# Patient Record
Sex: Female | Born: 1990 | Race: White | Hispanic: No | Marital: Married | Smoking: Former smoker
Health system: Southern US, Community
[De-identification: ages and names within clinical notes are randomized; demographics above are authoritative.]

## PROBLEM LIST (undated history)

## (undated) DIAGNOSIS — E282 Polycystic ovarian syndrome: Secondary | ICD-10-CM

## (undated) DIAGNOSIS — R112 Nausea with vomiting, unspecified: Secondary | ICD-10-CM

## (undated) DIAGNOSIS — N939 Abnormal uterine and vaginal bleeding, unspecified: Secondary | ICD-10-CM

## (undated) DIAGNOSIS — A749 Chlamydial infection, unspecified: Secondary | ICD-10-CM

## (undated) DIAGNOSIS — E669 Obesity, unspecified: Secondary | ICD-10-CM

## (undated) DIAGNOSIS — R791 Abnormal coagulation profile: Secondary | ICD-10-CM

## (undated) DIAGNOSIS — K439 Ventral hernia without obstruction or gangrene: Secondary | ICD-10-CM

## (undated) DIAGNOSIS — Z8041 Family history of malignant neoplasm of ovary: Secondary | ICD-10-CM

## (undated) HISTORY — DX: Obesity, unspecified: E66.9

## (undated) HISTORY — PX: TUBAL LIGATION: SHX77

## (undated) HISTORY — DX: Family history of malignant neoplasm of ovary: Z80.41

## (undated) HISTORY — DX: Chlamydial infection, unspecified: A74.9

---
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
## 2010-08-23 ENCOUNTER — Emergency Department: Payer: Self-pay | Admitting: Emergency Medicine

## 2011-02-13 ENCOUNTER — Observation Stay: Payer: Self-pay

## 2011-03-08 ENCOUNTER — Inpatient Hospital Stay: Payer: Self-pay | Admitting: Obstetrics and Gynecology

## 2012-09-17 ENCOUNTER — Emergency Department: Payer: Self-pay | Admitting: Emergency Medicine

## 2012-09-17 LAB — URINALYSIS, COMPLETE
Leukocyte Esterase: NEGATIVE
Nitrite: NEGATIVE
Ph: 5 (ref 4.5–8.0)
Protein: 100
RBC,UR: 2 /HPF (ref 0–5)
Specific Gravity: 1.034 (ref 1.003–1.030)
WBC UR: 3 /HPF (ref 0–5)

## 2012-09-17 LAB — WET PREP, GENITAL

## 2012-09-17 LAB — CBC
HGB: 13.9 g/dL (ref 12.0–16.0)
MCH: 30.5 pg (ref 26.0–34.0)
MCHC: 35.3 g/dL (ref 32.0–36.0)
Platelet: 192 10*3/uL (ref 150–440)
RBC: 4.57 10*6/uL (ref 3.80–5.20)

## 2012-09-17 LAB — GC/CHLAMYDIA PROBE AMP

## 2013-04-04 ENCOUNTER — Ambulatory Visit: Payer: Self-pay | Admitting: Obstetrics & Gynecology

## 2013-04-04 LAB — CBC WITH DIFFERENTIAL/PLATELET
Basophil #: 0 10*3/uL (ref 0.0–0.1)
Basophil %: 0.2 %
Eosinophil %: 1.5 %
HCT: 35.4 % (ref 35.0–47.0)
HGB: 12.2 g/dL (ref 12.0–16.0)
Lymphocyte #: 1.6 10*3/uL (ref 1.0–3.6)
Lymphocyte %: 15.4 %
MCH: 29.5 pg (ref 26.0–34.0)
MCHC: 34.3 g/dL (ref 32.0–36.0)
MCV: 86 fL (ref 80–100)
Neutrophil #: 8.2 10*3/uL — ABNORMAL HIGH (ref 1.4–6.5)
RBC: 4.13 10*6/uL (ref 3.80–5.20)
WBC: 10.6 10*3/uL (ref 3.6–11.0)

## 2013-04-06 ENCOUNTER — Inpatient Hospital Stay: Payer: Self-pay | Admitting: Obstetrics and Gynecology

## 2013-04-06 HISTORY — PX: TUBAL LIGATION: SHX77

## 2013-04-07 LAB — HEMATOCRIT: HCT: 29.1 % — ABNORMAL LOW (ref 35.0–47.0)

## 2013-04-09 LAB — PATHOLOGY REPORT

## 2013-09-14 IMAGING — US US OB < 14 WEEKS
1 series · 14 of 28 positions shown · non-contrast
Comparison: none

REASON FOR EXAM: pregnant, vaginal bleeding
COMMENTS:

PROCEDURE:     US  - US OB LESS THAN 14 WEEKS  - September 17, 2012  [DATE]
RESULT:     Limited OB ultrasound dated 09/17/2012
TECHNIQUE: Transabdominal imaging of the pelvis was obtained. Color flow
Doppler evaluation was also performed.

[Series 1: us ob < 14 weeks · 0.28mm/px · 14 of 64 slices shown]
[im 3/64]
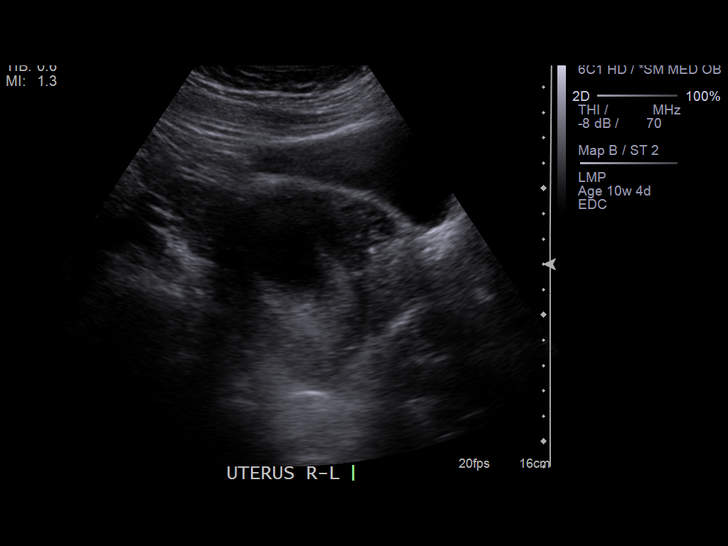
[im 8/64]
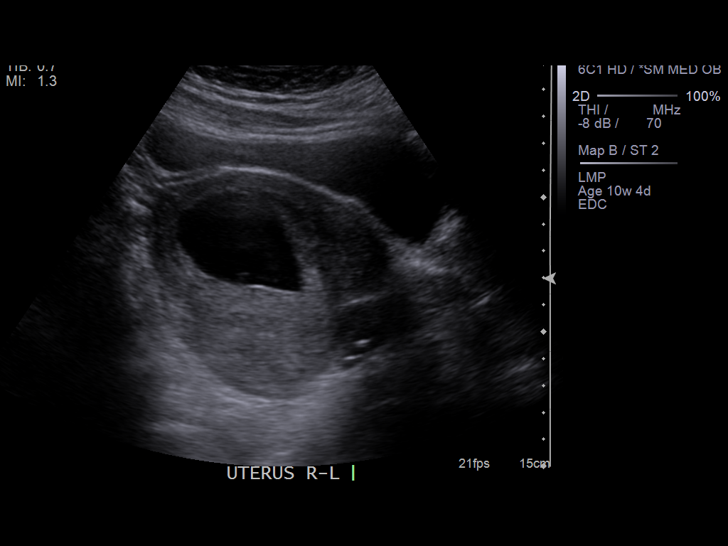
[im 12/64]
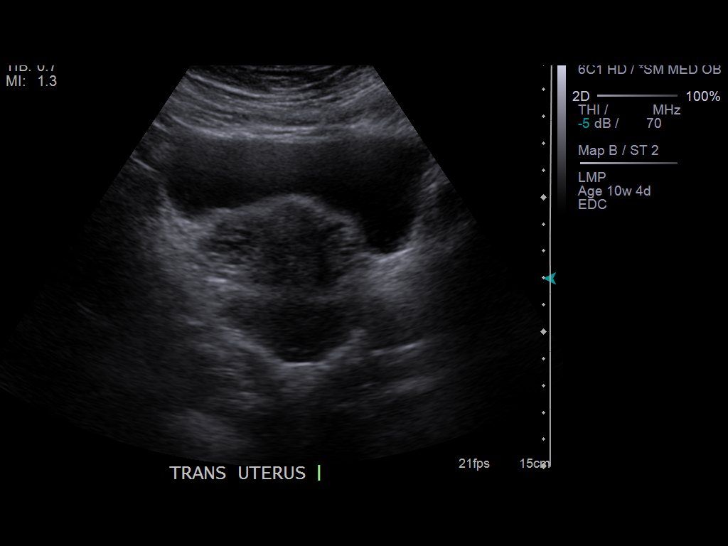
[im 17/64]
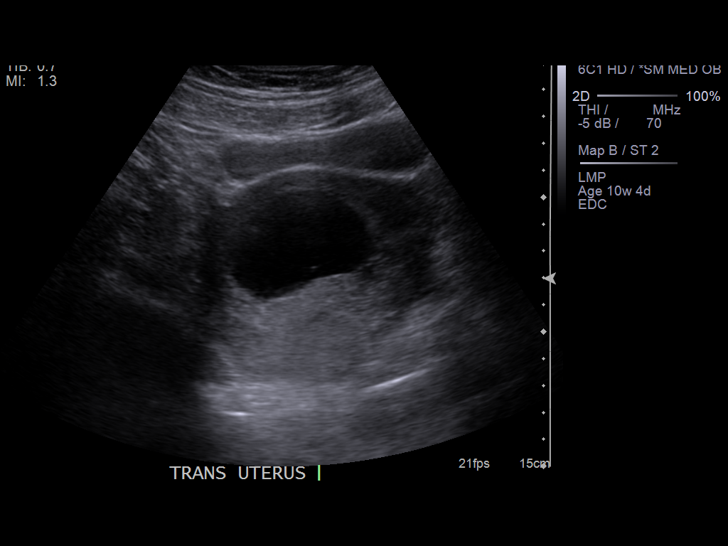
[im 22/64]
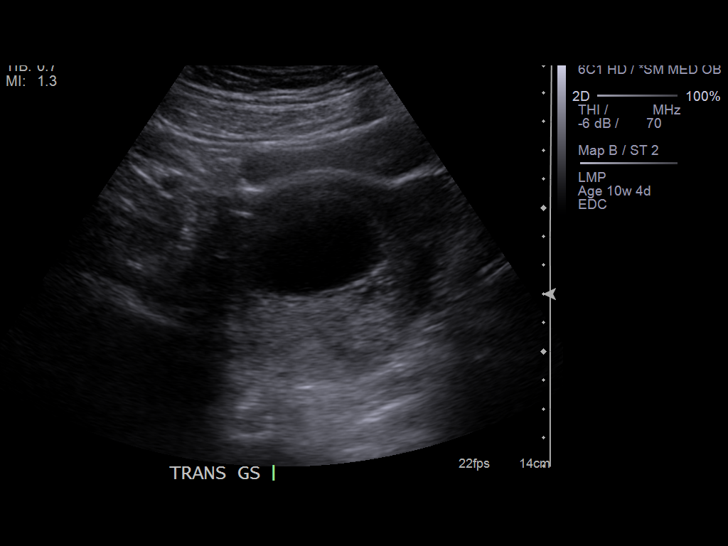
[im 26/64]
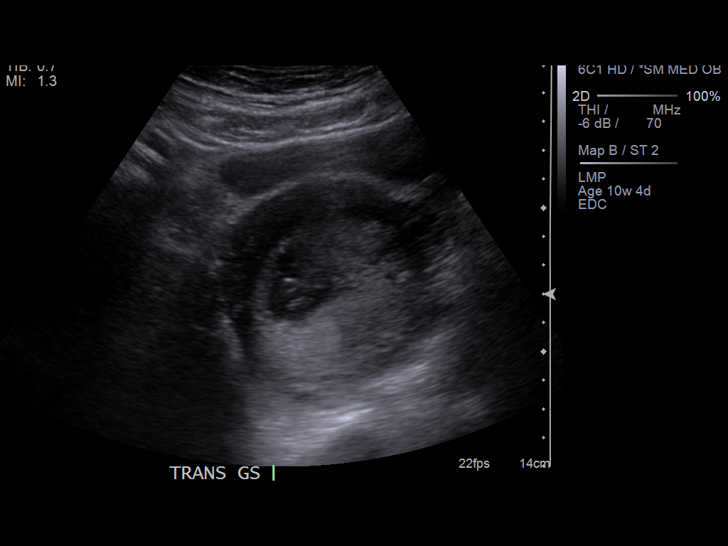
[im 31/64]
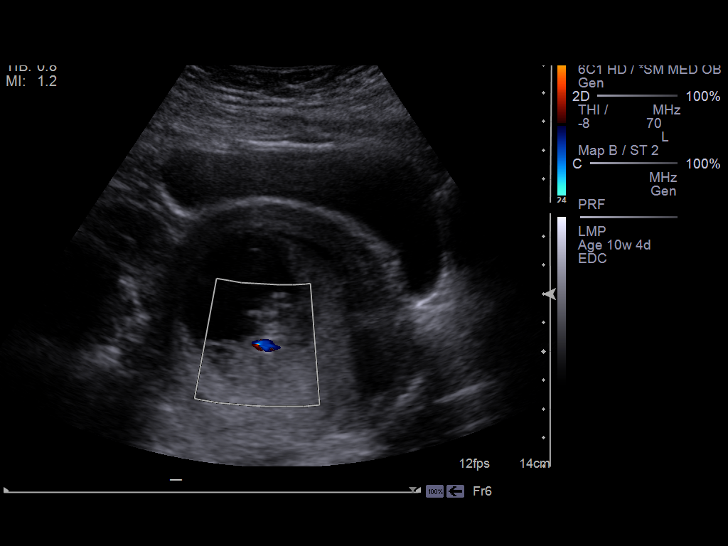
[im 36/64]
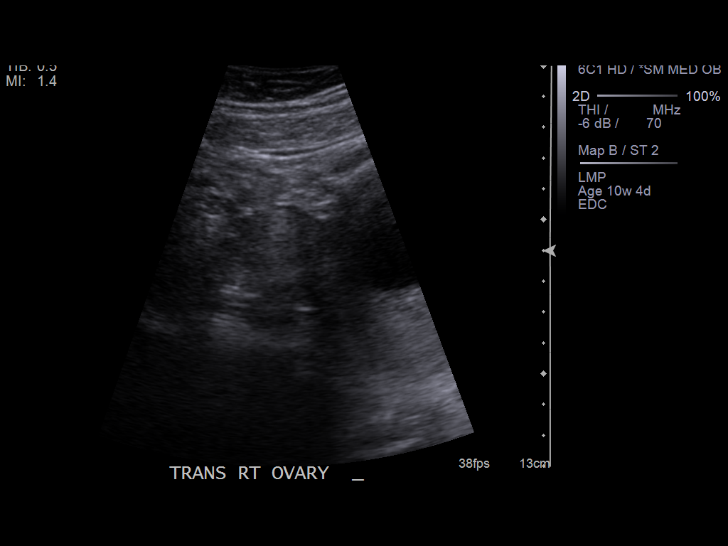
[im 40/64]
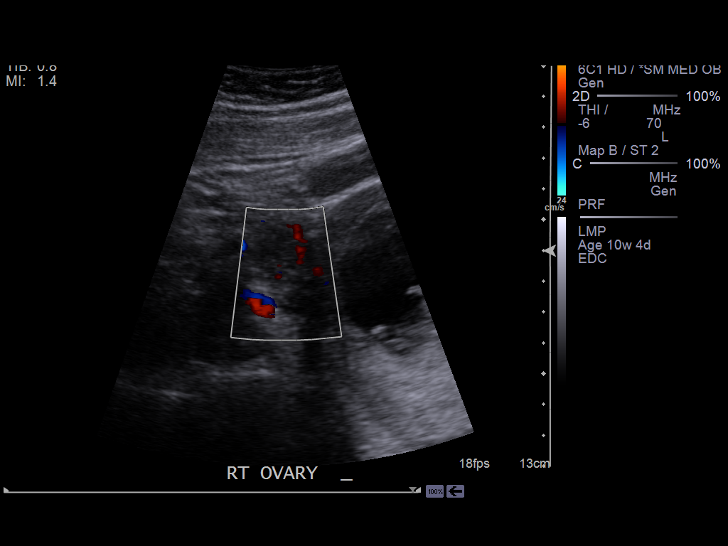
[im 45/64]
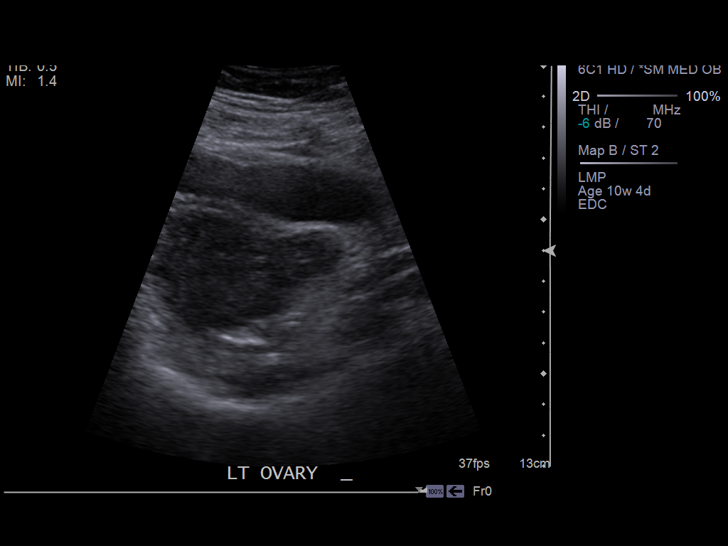
[im 50/64]
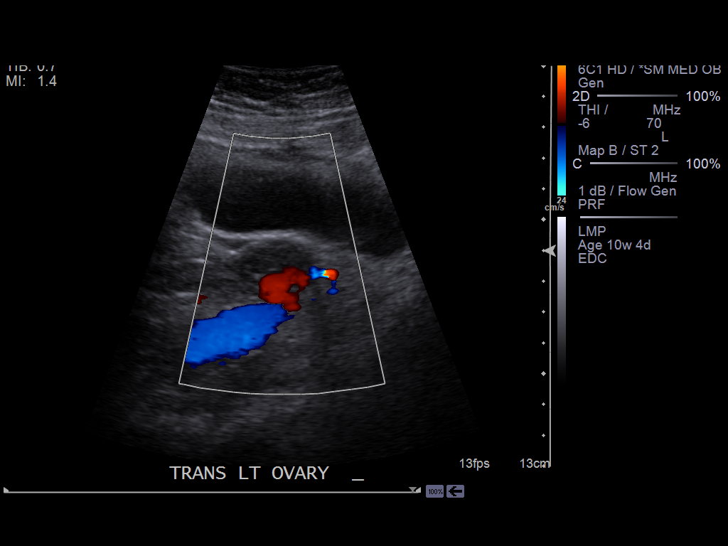
[im 54/64]
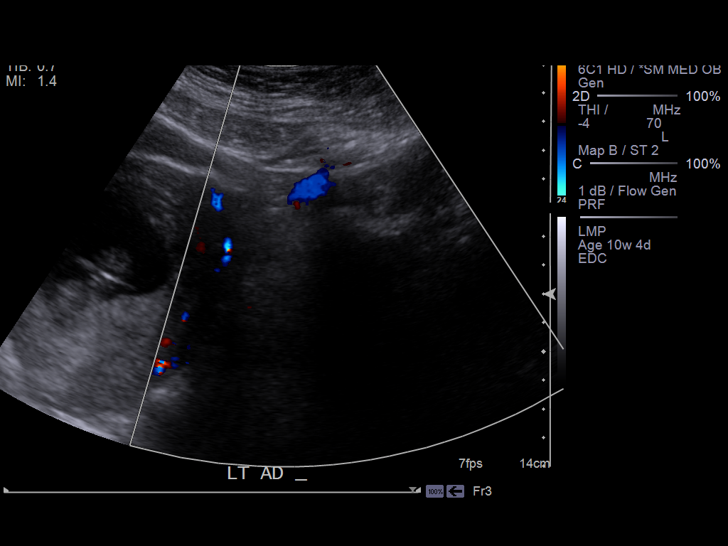
[im 59/64]
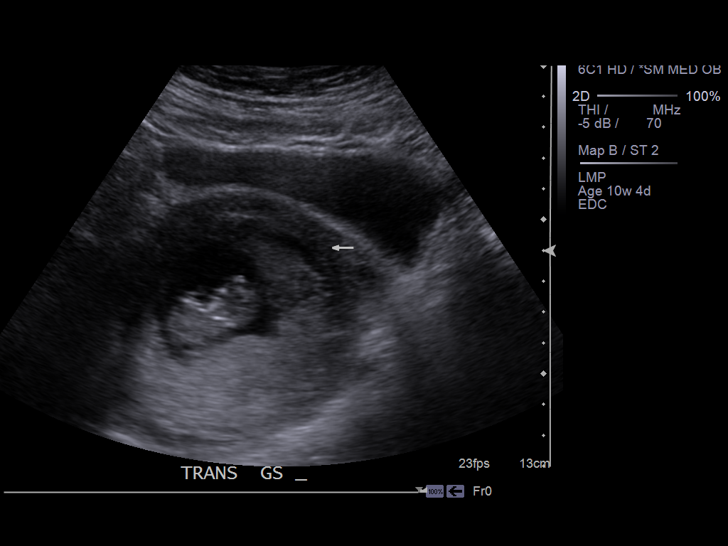
[im 64/64]
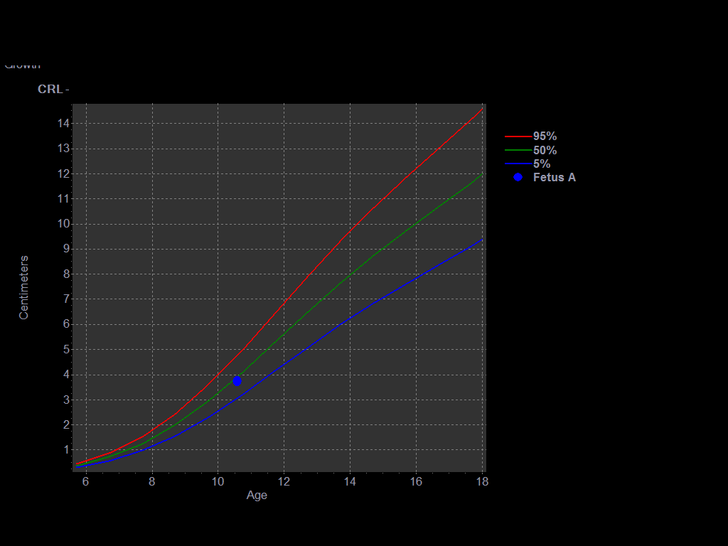

[14 of 28 positions shown; findings below may reference images not displayed]

FINDINGS: A single viable intrauterine pregnancy is appreciated within
estimated gestational age of 10 weeks 4 days based on a crown-rump length of
3.70 centimeters. Fetal heart tones identified with an estimated rate of 173
beats per minute. A crescent shaped hypoechoic area projects adjacent to the
gestational sac measuring approximately 2.5 to 3 cm in length. This finding
is appreciated on images 59 through 65. This finding is suspicious for a
subchorionic hemorrhage.

Adnexal regions are unremarkable. The right ovary measures 2.02 x 2.63 x
1.92 cm and the left 2.45 x 1.49 x 2.54 cm.
IMPRESSION: Single viable intrauterine pregnancy as described above.
There are findings may represent a small subchorionic hemorrhage
surveillance evaluation recommended.

## 2014-08-30 NOTE — Op Note (Signed)
PATIENT NAME:  Donna Fletcher, Donna Fletcher MR#:  [id number] DATE OF BIRTH:  1991-05-07  DATE OF PROCEDURE:  04/06/2013  PREOPERATIVE DIAGNOSES:  1.  Term pregnancy.  2.  History of prior birth trauma. 3.  Macrosomia. 4.  Desire for permanent sterility.   POSTOPERATIVE DIAGNOSES: 1.  Term pregnancy.  2.  History of prior birth trauma. 3.  Macrosomia. 4.  Desire for permanent sterility.   PROCEDURE PERFORMED:  1,  Low transverse cesarean section.  2.  Bilateral tubal ligation.  3.  Placement of On-Q pain pump.   SURGEON: Annamarie MajorPaul Carnisha Feltz, M.D.   ASSISTANT: Midwife, Sharen HonesGutierrez.   ANESTHESIA: Spinal.   ESTIMATED BLOOD LOSS: 500 mL.   COMPLICATIONS: None.   FINDINGS: Normal tubes, ovaries, and uterus. Viable female infant weighing 912 with Apgars of 9 and 9.   DISPOSITION: To recovery room stable.   TECHNIQUE: The patient is prepped and draped in the usual sterile fashion after adequate anesthesia is obtained in the supine position on the operating room table. A scalpel was used to create a low transverse skin incision down to the level of the rectus fascia, which is then dissected bilaterally using Mayo scissors. The rectus muscles are dissected away from the rectus fascia and separated in the midline. The peritoneum is penetrated and the bladder is inferiorly dissected and retracted. A scalpel was used to create a low transverse hysterotomy incision that is extended by blunt dissection. Amniotomy reveals clear fluid. The infant'Fletcher head is grasped and delivered without the use of a suction device and the oropharynx is suctioned. The infant is then completely delivered and handed to the pediatric team.   Cord blood is obtained and the placenta is manually extracted. The uterus is externalized and cleansed of all membranes and debris using a moist sponge. The hysterotomy incision is closed with running 1 Vicryl suture in a locking fashion with excellent hemostasis noted.   The right and left fallopian  tubes are grasped in the midportion with Babcock clamp and a loop is tied with 2 Vicryl sutures, excised, and cauterized. Attention is made to check for tube and fimbria and there is no bleeding at the conclusion of this part of the procedure.   The uterus is placed back in the intra-abdominal cavity and the paracolic gutters are irrigated with warm saline. Re-examination of all incisions reveals excellent hemostasis. The peritoneum is closed. Trocars are placed through the abdomen into the subfascial space for placement of the Silver soaker catheters related to the On-Q pain pump system. The fascia is then closed using a Maxon suture to overlie the catheters with careful placement not to incorporate the catheters in the suture. Subcutaneous tissues are irrigated and hemostasis is assured using electrocautery. Skin is closed with 4-0 Vicryl suture in a subcuticular fashion followed by placement of Dermabond. The catheters are stabilized in place using Dermabond followed by Steri-Strips and a sterile bandage. The patient goes to the recovery room in stable condition. All sponge, instrument, and needle counts are correct at the conclusion of the case.   ____________________________ R. Annamarie MajorPaul Solstice Lastinger, MD rph:aw D: 04/06/2013 08:45:36 ET T: 04/06/2013 09:35:49 ET JOB#: [id number]  cc: Dierdre Searles. Paul Jaslyn Bansal, MD, <Dictator> Nadara MustardOBERT P Tyeshia Cornforth MD ELECTRONICALLY SIGNED 04/09/2013 7:20

## 2015-08-13 ENCOUNTER — Ambulatory Visit: Payer: Self-pay | Admitting: Family Medicine

## 2015-08-15 ENCOUNTER — Ambulatory Visit: Payer: Self-pay | Admitting: Family Medicine

## 2015-08-27 ENCOUNTER — Ambulatory Visit: Payer: Self-pay | Admitting: Family Medicine

## 2015-08-27 ENCOUNTER — Other Ambulatory Visit: Payer: Self-pay

## 2015-09-01 ENCOUNTER — Ambulatory Visit: Payer: Self-pay | Admitting: Family Medicine

## 2015-09-09 ENCOUNTER — Encounter: Payer: Self-pay | Admitting: Emergency Medicine

## 2015-09-09 ENCOUNTER — Ambulatory Visit
Admission: EM | Admit: 2015-09-09 | Discharge: 2015-09-09 | Disposition: A | Discharge status: Home / Self Care | Payer: BLUE CROSS/BLUE SHIELD | Attending: Family Medicine | Admitting: Family Medicine

## 2015-09-09 DIAGNOSIS — J0100 Acute maxillary sinusitis, unspecified: Secondary | ICD-10-CM | POA: Diagnosis not present

## 2015-09-09 DIAGNOSIS — J0110 Acute frontal sinusitis, unspecified: Secondary | ICD-10-CM

## 2015-09-09 MED ORDER — FLUCONAZOLE 150 MG PO TABS: 150.0000 mg | ORAL_TABLET | Freq: Every day | ORAL | Status: DC

## 2015-09-09 MED ORDER — LORATADINE-PSEUDOEPHEDRINE ER 5-120 MG PO TB12
1.0000 | ORAL_TABLET | Freq: Two times a day (BID) | ORAL | Status: DC
Start: 2015-09-09 — End: 2015-10-19

## 2015-09-09 MED ORDER — AMOXICILLIN-POT CLAVULANATE 875-125 MG PO TABS: 1.0000 | ORAL_TABLET | Freq: Two times a day (BID) | ORAL | Status: DC

## 2015-09-09 NOTE — ED Provider Notes (Signed)
[facility]  ____________________________________________  Time seen: Approximately 6:02 PM  I have reviewed the triage vital signs and the nursing notes.   HISTORY  Chief Complaint Nasal Congestion and Facial Pain  HPI Donna Fletcher is a 25 y.o. female  presents for complaint of 2 weeks of runny nose, nasal congestion, nasal drainage and sinus pressure. Also reports sneezing and occasionally watery eyes. Patient reports she thought that she was having some seasonal allergies but states the symptoms progressed and have not resolved with over-the-counter antihistamines. Reports continues to eat and drink well. Reports felt she may have had a fever earlier on but denies recent fever. States recently blowing her nose and getting very thick greenish nasal drainage out. 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 Denies pain but states that her sinuses in the front of her face and forehead feel clogged. Denies home sick contacts.  Denies chest pain, shortness of breath, abdominal pain, wheezing, chest pain with deep breath, neck pain, back pain, dysuria, extremity pain or extremity swelling. Denies recent sickness. Denies recent antibiotic use. Reports symptoms have been unresolved with over-the-counter antihistamines, Mucinex, cough and congestion medications.   Patient's last menstrual period was 08/19/2015 (approximate). Denies chance of pregnancy.    History reviewed. No pertinent past medical history.  There are no active problems to display for this patient.   Past Surgical History  Procedure Laterality Date  . Cesarean section    . Tubal ligation      Current Outpatient Rx  Name  Route  Sig  Dispense  Refill  . loratadine (CLARITIN) 10 MG tablet   Oral   Take 10 mg by mouth daily.         . Norethin Ace-Eth Estrad-FE (TAYTULLA) 1-20 MG-MCG(24) CAPS   Oral   Take 1 capsule by mouth daily.         Marland Kitchen BELVIQ  10 MG TABS   Oral   Take 1 tablet by mouth 2 (two) times daily.      4     Dispense as written.   .           . phentermine (ADIPEX-P) 37.5 MG tablet      TAKE 1 TABLET BY MOUTH ONCE DAILY 30 MINUTES BEFORE BREAKFAST      0     Allergies Review of patient's allergies indicates no known allergies.  History reviewed. No pertinent family history.  Social History Social History  Substance Use Topics  . Smoking status: Former Games developer  . Smokeless tobacco: None  . Alcohol Use: No    Review of Systems Constitutional: As above.  Eyes: No visual changes. ENT: As above. Cardiovascular: Denies chest pain. Respiratory: Denies shortness of breath. Gastrointestinal: No abdominal pain.  No nausea, no vomiting.  No diarrhea.  No constipation. Genitourinary: Negative for dysuria. Musculoskeletal: Negative for back pain. Skin: Negative for rash. Neurological: Negative for headaches, focal weakness or numbness.  10-point ROS otherwise negative.  ____________________________________________   PHYSICAL EXAM:  VITAL SIGNS: ED Triage Vitals  Enc Vitals Group     BP 09/09/15 1734 109/73 mmHg     Pulse Rate 09/09/15 1734 85     Resp 09/09/15 1734 17     Temp 09/09/15 1734 98.1 F (36.7 C)     Temp Source 09/09/15 1734 Oral     SpO2 09/09/15 1734 100 %     Weight 09/09/15 1734 230 lb (104.327 kg)  Height 09/09/15 1734 5\' 4"  (1.626 m)     Head Cir --      Peak Flow --      Pain Score 09/09/15 1738 2     Pain Loc --      Pain Edu? --      Excl. in GC? --   Constitutional: Alert and oriented. Well appearing and in no acute distress. Eyes: Conjunctivae are normal. PERRL. EOMI. Head: Atraumatic.Mild to moderate tenderness to palpation bilateral frontal and maxillary sinuses. No swelling. No erythema.   Ears: no erythema, normal TMs bilaterally.   Nose: nasal congestion with bilateral nasal turbinate erythema and edema.   Mouth/Throat: Mucous membranes are moist.  Oropharynx  non-erythematous.No tonsillar swelling or exudate.  Neck: No stridor.  No cervical spine tenderness to palpation. Hematological/Lymphatic/Immunilogical: No cervical lymphadenopathy. Cardiovascular: Normal rate, regular rhythm. Grossly normal heart sounds.  Good peripheral circulation. Respiratory: Normal respiratory effort.  No retractions. Lungs CTAB. No wheezes, rales or rhonchi. Good air movement.  Gastrointestinal: Soft and nontender. No distention. Normal Bowel sounds. No CVA tenderness. Musculoskeletal: No lower or upper extremity tenderness nor edema.  Bilateral pedal pulses equal and easily palpated. No cervical, thoracic or lumbar tenderness to palpation.  Neurologic:  Normal speech and language. No gross focal neurologic deficits are appreciated. No gait instability. Skin:  Skin is warm, dry and intact. No rash noted. Psychiatric: Mood and affect are normal. Speech and behavior are normal.  ____________________________________________   LABS (all labs ordered are listed, but only abnormal results are displayed)  Labs Reviewed - No data to display   INITIAL IMPRESSION / ASSESSMENT AND PLAN / ED COURSE  Pertinent labs & imaging results that were available during my care of the patient were reviewed by me and considered in my medical decision making (see chart for details).  Very well-appearing patient. No acute distress. Presents for the complaints of 2 weeks of runny nose, nasal congestion, sinus drainage and sinus pressure. Lungs clear throughout. Abdomen soft and nontender. Moist membranes. Suspect sinusitis. Will treat with oral Augmentin and Claritin-D. Encouraged rest, fluids, saline nasal spray or use of Nettie pot. Encourage PCP follow-up. Patient requests prescription for Diflucan tablet in case she needs for a yeast infection second to antibiotic as she has a history of vaginal yeast infections from antibiotics, prescription given. Discussed indication, risks and benefits of  medications with patient.  Discussed follow up with Primary care physician this week. Discussed follow up and return parameters including no resolution or any worsening concerns. Patient verbalized understanding and agreed to plan.   ____________________________________________   FINAL CLINICAL IMPRESSION(S) / ED DIAGNOSES  Final diagnoses:  Acute maxillary sinusitis, recurrence not specified  Acute frontal sinusitis, recurrence not specified      Note: This dictation was prepared with Dragon dictation along with smaller phrase technology. Any transcriptional errors that result from this process are unintentional.    Renford DillsLindsey Jaquel Coomer, NP 09/09/15 1928  Renford DillsLindsey Adeli Frost, NP 09/09/15 1929

## 2015-09-09 NOTE — ED Notes (Signed)
Patient c/o sinus congestion, runny nose, and slight cough for the past 2 weeks.  Patient denies fevers.

## 2015-09-09 NOTE — Discharge Instructions (Signed)
Take medication as prescribed. Rest. Drink plenty of fluids.  ° °Follow up with your primary care physician this week as needed. Return to Urgent care for new or worsening concerns.  ° ° °Sinusitis, Adult °Sinusitis is redness, soreness, and inflammation of the paranasal sinuses. Paranasal sinuses are air pockets within the bones of your face. They are located beneath your eyes, in the middle of your forehead, and above your eyes. In healthy paranasal sinuses, mucus is able to drain out, and air is able to circulate through them by way of your nose. However, when your paranasal sinuses are inflamed, mucus and air can become trapped. This can allow bacteria and other germs to grow and cause infection. °Sinusitis can develop quickly and last only a short time (acute) or continue over a long period (chronic). Sinusitis that lasts for more than 12 weeks is considered chronic. °CAUSES °Causes of sinusitis include: °· Allergies. °· Structural abnormalities, such as displacement of the cartilage that separates your nostrils (deviated septum), which can decrease the air flow through your nose and sinuses and affect sinus drainage. °· Functional abnormalities, such as when the small hairs (cilia) that line your sinuses and help remove mucus do not work properly or are not present. °SIGNS AND SYMPTOMS °Symptoms of acute and chronic sinusitis are the same. The primary symptoms are pain and pressure around the affected sinuses. Other symptoms include: °· Upper toothache. °· Earache. °· Headache. °· Bad breath. °· Decreased sense of smell and taste. °· A cough, which worsens when you are lying flat. °· Fatigue. °· Fever. °· Thick drainage from your nose, which often is green and may contain pus (purulent). °· Swelling and warmth over the affected sinuses. °DIAGNOSIS °Your health care provider will perform a physical exam. During your exam, your health care provider may perform any of the following to help determine if you have  acute sinusitis or chronic sinusitis: °· Look in your nose for signs of abnormal growths in your nostrils (nasal polyps). °· Tap over the affected sinus to check for signs of infection. °· View the inside of your sinuses using an imaging device that has a light attached (endoscope). °If your health care provider suspects that you have chronic sinusitis, one or more of the following tests may be recommended: °· Allergy tests. °· Nasal culture. A sample of mucus is taken from your nose, sent to a lab, and screened for bacteria. °· Nasal cytology. A sample of mucus is taken from your nose and examined by your health care provider to determine if your sinusitis is related to an allergy. °TREATMENT °Most cases of acute sinusitis are related to a viral infection and will resolve on their own within 10 days. Sometimes, medicines are prescribed to help relieve symptoms of both acute and chronic sinusitis. These may include pain medicines, decongestants, nasal steroid sprays, or saline sprays. °However, for sinusitis related to a bacterial infection, your health care provider will prescribe antibiotic medicines. These are medicines that will help kill the bacteria causing the infection. °Rarely, sinusitis is caused by a fungal infection. In these cases, your health care provider will prescribe antifungal medicine. °For some cases of chronic sinusitis, surgery is needed. Generally, these are cases in which sinusitis recurs more than 3 times per year, despite other treatments. °HOME CARE INSTRUCTIONS °· Drink plenty of water. Water helps thin the mucus so your sinuses can drain more easily. °· Use a humidifier. °· Inhale steam 3-4 times a day (for example, sit in   the bathroom with the shower running). °· Apply a warm, moist washcloth to your face 3-4 times a day, or as directed by your health care provider. °· Use saline nasal sprays to help moisten and clean your sinuses. °· Take medicines only as directed by your health care  provider. °· If you were prescribed either an antibiotic or antifungal medicine, finish it all even if you start to feel better. °SEEK IMMEDIATE MEDICAL CARE IF: °· You have increasing pain or severe headaches. °· You have nausea, vomiting, or drowsiness. °· You have swelling around your face. °· You have vision problems. °· You have a stiff neck. °· You have difficulty breathing. °  °This information is not intended to replace advice given to you by your health care provider. Make sure you discuss any questions you have with your health care provider. °  °Document Released: 04/26/2005 Document Revised: 05/17/2014 Document Reviewed: 05/11/2011 °Elsevier Interactive Patient Education ©2016 [employer] ° °

## 2015-10-18 ENCOUNTER — Emergency Department
Admission: EM | Admit: 2015-10-18 | Discharge: 2015-10-19 | Disposition: A | Discharge status: Home / Self Care | Payer: BLUE CROSS/BLUE SHIELD | Attending: Emergency Medicine | Admitting: Emergency Medicine

## 2015-10-18 DIAGNOSIS — N390 Urinary tract infection, site not specified: Secondary | ICD-10-CM

## 2015-10-18 DIAGNOSIS — Z87891 Personal history of nicotine dependence: Secondary | ICD-10-CM | POA: Insufficient documentation

## 2015-10-18 DIAGNOSIS — R109 Unspecified abdominal pain: Secondary | ICD-10-CM | POA: Diagnosis present

## 2015-10-18 DIAGNOSIS — Z79899 Other long term (current) drug therapy: Secondary | ICD-10-CM | POA: Insufficient documentation

## 2015-10-18 NOTE — ED Notes (Signed)
Pt reports to ED w/ c/o R flank pain that incr w/ urination.  Pt also c/o vaginal pain/discharge. Ambulatory to triage, resp even and unlabored. NAD.

## 2015-10-19 LAB — WET PREP, GENITAL
CLUE CELLS WET PREP: NONE SEEN
SPERM: NONE SEEN
TRICH WET PREP: NONE SEEN
Yeast Wet Prep HPF POC: NONE SEEN

## 2015-10-19 LAB — CHLAMYDIA/NGC RT PCR (ARMC ONLY)
CHLAMYDIA TR: NOT DETECTED
N gonorrhoeae: NOT DETECTED

## 2015-10-19 LAB — URINALYSIS COMPLETE WITH MICROSCOPIC (ARMC ONLY)
Bilirubin Urine: NEGATIVE
Glucose, UA: NEGATIVE mg/dL
Hgb urine dipstick: NEGATIVE
Ketones, ur: NEGATIVE mg/dL
Nitrite: NEGATIVE
PH: 7 (ref 5.0–8.0)
PROTEIN: NEGATIVE mg/dL
Specific Gravity, Urine: 1.005 (ref 1.005–1.030)

## 2015-10-19 LAB — PREGNANCY, URINE: Preg Test, Ur: NEGATIVE

## 2015-10-19 MED ORDER — NITROFURANTOIN MONOHYD MACRO 100 MG PO CAPS: 100.0000 mg | ORAL_CAPSULE | Freq: Two times a day (BID) | ORAL | Status: AC

## 2015-10-19 NOTE — Discharge Instructions (Signed)
Please seek medical attention for any high fevers, chest pain, shortness of breath, change in behavior, persistent vomiting, bloody stool or any other new or concerning symptoms. ° ° °Urinary Tract Infection °A urinary tract infection (UTI) can occur any place along the urinary tract. The tract includes the kidneys, ureters, bladder, and urethra. A type of germ called bacteria often causes a UTI. UTIs are often helped with antibiotic medicine.  °HOME CARE  °· If given, take antibiotics as told by your doctor. Finish them even if you start to feel better. °· Drink enough fluids to keep your pee (urine) clear or pale yellow. °· Avoid tea, drinks with caffeine, and bubbly (carbonated) drinks. °· Pee often. Avoid holding your pee in for a long time. °· Pee before and after having sex (intercourse). °· Wipe from front to back after you poop (bowel movement) if you are a woman. Use each tissue only once. °GET HELP RIGHT AWAY IF:  °· You have back pain. °· You have lower belly (abdominal) pain. °· You have chills. °· You feel sick to your stomach (nauseous). °· You throw up (vomit). °· Your burning or discomfort with peeing does not go away. °· You have a fever. °· Your symptoms are not better in 3 days. °MAKE SURE YOU:  °· Understand these instructions. °· Will watch your condition. °· Will get help right away if you are not doing well or get worse. °  °This information is not intended to replace advice given to you by your health care provider. Make sure you discuss any questions you have with your health care provider. °  °Document Released: 10/13/2007 Document Revised: 05/17/2014 Document Reviewed: 11/25/2011 °Elsevier Interactive Patient Education ©2016 [employer] ° °

## 2015-10-19 NOTE — ED Provider Notes (Signed)
[facility] Emergency Department Provider Note   ____________________________________________  Time seen: ~0035  I have reviewed the triage vital signs and the nursing notes.   HISTORY  Chief Complaint Flank Pain   History limited by: Not Limited   HPI Donna Fletcher is a 25 y.o. female who presents to the emergency department today because of concerns for right flank pain and pain with urination. The patient states that she started having this is gout. It has been persistent since. She describes it as burning. It is worse with urination.She also feels like she has had increased frequency of urination. In addition the patient states that she has been having vaginal discharge. She states that it does smell like yeast. No fevers. Some nausea. No vomiting.    History reviewed. No pertinent past medical history.  There are no active problems to display for this patient.   Past Surgical History  Procedure Laterality Date  . Cesarean section    . Tubal ligation      Current Outpatient Rx  Name  Route  Sig  Dispense  Refill  . amoxicillin-clavulanate (AUGMENTIN) 875-125 MG tablet   Oral   Take 1 tablet by mouth every 12 (twelve) hours.   20 tablet   0   . BELVIQ 10 MG TABS   Oral   Take 1 tablet by mouth 2 (two) times daily.      4     Dispense as written.   . fluconazole (DIFLUCAN) 150 MG tablet   Oral   Take 1 tablet (150 mg total) by mouth daily.   1 tablet   0   . loratadine (CLARITIN) 10 MG tablet   Oral   Take 10 mg by mouth daily.         Marland Kitchen loratadine-pseudoephedrine (CLARITIN-D 12 HOUR) 5-120 MG tablet   Oral   Take 1 tablet by mouth 2 (two) times daily.   10 tablet   0   . Norethin Ace-Eth Estrad-FE (TAYTULLA) 1-20 MG-MCG(24) CAPS   Oral   Take 1 capsule by mouth daily.         . phentermine (ADIPEX-P) 37.5 MG tablet      TAKE 1 TABLET BY MOUTH ONCE DAILY 30 MINUTES BEFORE BREAKFAST      0      Allergies Review of patient's allergies indicates no known allergies.  No family history on file.  Social History Social History  Substance Use Topics  . Smoking status: Former Games developer  . Smokeless tobacco: None  . Alcohol Use: No    Review of Systems  Constitutional: Negative for fever. Cardiovascular: Negative for chest pain. Respiratory: Negative for shortness of breath. Gastrointestinal: Right sided pain. Neurological: Negative for headaches, focal weakness or numbness.  10-point ROS otherwise negative.  ____________________________________________   PHYSICAL EXAM:  VITAL SIGNS: ED Triage Vitals  Enc Vitals Group     BP 10/18/15 2234 133/95 mmHg     Pulse Rate 10/18/15 2234 88     Resp 10/18/15 2234 20     Temp 10/18/15 2234 98.1 F (36.7 C)     Temp Source 10/18/15 2234 Oral     SpO2 10/18/15 2234 99 %     Weight 10/18/15 2234 230 lb (104.327 kg)     Height 10/18/15 2234  (1.651 m)     Head Cir --      Peak Flow --      Pain Score 10/18/15 2235 6   Constitutional: Alert  and oriented. Well appearing and in no distress. Eyes: Conjunctivae are normal. PERRL. Normal extraocular movements. ENT   Head: Normocephalic and atraumatic.   Nose: No congestion/rhinnorhea.   Mouth/Throat: Mucous membranes are moist.   Neck: No stridor. Hematological/Lymphatic/Immunilogical: No cervical lymphadenopathy. Cardiovascular: Normal rate, regular rhythm.  No murmurs, rubs, or gallops. Respiratory: Normal respiratory effort without tachypnea nor retractions. Breath sounds are clear and equal bilaterally. No wheezes/rales/rhonchi. Gastrointestinal: Soft and nontender. No distention. There is no CVA tenderness. Genitourinary: No external lesions. Small amount of discharge in vaginal vault. No blood. No CMT. No adnexal fullness or tenderness.  Musculoskeletal: Normal range of motion in all extremities. No joint effusions.  No lower extremity tenderness nor  edema. Neurologic:  Normal speech and language. No gross focal neurologic deficits are appreciated.  Skin:  Skin is warm, dry and intact. No rash noted. Psychiatric: Mood and affect are normal. Speech and behavior are normal. Patient exhibits appropriate insight and judgment.  ____________________________________________    LABS (pertinent positives/negatives)  Labs Reviewed  URINALYSIS COMPLETEWITH MICROSCOPIC ([facility] ONLY) - Abnormal; Notable for the following:    Color, Urine STRAW (*)    APPearance CLEAR (*)    Leukocytes, UA TRACE (*)    Bacteria, UA RARE (*)    Squamous Epithelial / LPF 0-5 (*)    All other components within normal limits  PREGNANCY, URINE     ____________________________________________   EKG  None  ____________________________________________    RADIOLOGY  None  ____________________________________________   PROCEDURES  Procedure(s) performed: None  Critical Care performed: No  ____________________________________________   INITIAL IMPRESSION / ASSESSMENT AND PLAN / ED COURSE  Pertinent labs & imaging results that were available during my care of the patient were reviewed by me and considered in my medical decision making (see chart for details).  Patient presented to the emergency department today because of concerns for right sided flank pain and painful urination. Additionally patient has increased frequency and feeling of frequent need to urinate. Urine showed some leukocytes however no other concerning signs of infection. Pelvic exam was performed which did not have any adnexal tenderness or fullness. At this point I think patient might be suffering from urinary tract infection even with a fairly unremarkable UA. Will plan on treating with antibiotics. I doubt appendicitis at this point given lack of fever or significant tenderness. Additionally doubt tubo-ovarian abscess or other ovarian pathology given lack of adnexal tenderness.  Will plan on giving antibiotics for UTI and return precautions.  ____________________________________________   FINAL CLINICAL IMPRESSION(S) / ED DIAGNOSES  Final diagnoses:  Flank pain  UTI (lower urinary tract infection)     Note: This dictation was prepared with Dragon dictation. Any transcriptional errors that result from this process are unintentional    Phineas SemenGraydon Tisa Weisel, MD 10/19/15 [id number]

## 2016-03-31 ENCOUNTER — Ambulatory Visit
Admission: EM | Admit: 2016-03-31 | Discharge: 2016-03-31 | Disposition: A | Discharge status: Home / Self Care | Payer: BLUE CROSS/BLUE SHIELD | Attending: Emergency Medicine | Admitting: Emergency Medicine

## 2016-03-31 ENCOUNTER — Encounter: Payer: Self-pay | Admitting: *Deleted

## 2016-03-31 DIAGNOSIS — R350 Frequency of micturition: Secondary | ICD-10-CM

## 2016-03-31 DIAGNOSIS — N760 Acute vaginitis: Secondary | ICD-10-CM

## 2016-03-31 DIAGNOSIS — R3915 Urgency of urination: Secondary | ICD-10-CM

## 2016-03-31 DIAGNOSIS — B9689 Other specified bacterial agents as the cause of diseases classified elsewhere: Secondary | ICD-10-CM

## 2016-03-31 DIAGNOSIS — R1030 Lower abdominal pain, unspecified: Secondary | ICD-10-CM

## 2016-03-31 DIAGNOSIS — R109 Unspecified abdominal pain: Secondary | ICD-10-CM

## 2016-03-31 LAB — URINALYSIS COMPLETE WITH MICROSCOPIC (ARMC ONLY)
Bilirubin Urine: NEGATIVE
Glucose, UA: NEGATIVE mg/dL
HGB URINE DIPSTICK: NEGATIVE
Ketones, ur: NEGATIVE mg/dL
NITRITE: NEGATIVE
SPECIFIC GRAVITY, URINE: 1.02 (ref 1.005–1.030)
pH: 7 (ref 5.0–8.0)

## 2016-03-31 LAB — CHLAMYDIA/NGC RT PCR (ARMC ONLY)
Chlamydia Tr: NOT DETECTED
N gonorrhoeae: NOT DETECTED

## 2016-03-31 LAB — WET PREP, GENITAL
Clue Cells Wet Prep HPF POC: NONE SEEN
SPERM: NONE SEEN
Trich, Wet Prep: NONE SEEN
Yeast Wet Prep HPF POC: NONE SEEN

## 2016-03-31 MED ORDER — NITROFURANTOIN MONOHYD MACRO 100 MG PO CAPS: 100.0000 mg | ORAL_CAPSULE | Freq: Two times a day (BID) | ORAL | 0 refills | Status: AC

## 2016-03-31 MED ORDER — NITROFURANTOIN MONOHYD MACRO 100 MG PO CAPS: 100.0000 mg | ORAL_CAPSULE | Freq: Two times a day (BID) | ORAL | 0 refills | Status: DC

## 2016-03-31 MED ORDER — METRONIDAZOLE 500 MG PO TABS: 500.0000 mg | ORAL_TABLET | Freq: Two times a day (BID) | ORAL | 0 refills | Status: DC

## 2016-03-31 NOTE — ED Triage Notes (Signed)
Patient started having symptoms of lower back pain, urinary frequency and retention.

## 2016-03-31 NOTE — Discharge Instructions (Signed)
I believe you have an UTI.  I want you to go ahead and start taking macrobid for your UTI. Meanwhile, if you urine culture comes back negative, then you may stop it. Also I want you to f/u with a healthcare provider in 2 weeks to have your urine recheck to make sure the protein in your urine has resolved.

## 2016-03-31 NOTE — ED Provider Notes (Signed)
CSN: [id number]     Arrival date & time 03/31/16  1233 History   None    Chief Complaint  Patient presents with  . Urinary Tract Infection   (Consider location/radiation/quality/duration/timing/severity/associated sxs/prior Treatment) Donna Fletcher is a well-appearing 25 y.o female, presents today for possible UTI. Patient reports right flank pain onset 7 days ago. Flank pain is an intermittent dull ache associated with urinary urgency and frequency. She has no dysuria however reports flank pain when she urinates. She denies blood in urine. She also reports slight vaginal irritation and vaginal pain. She denies painful sex, denies vaginal discharge, denies vaginal itchiness or odor. She does endorses some lower abdominal discomfort. She called her OBGYN office last week and was recommended to try Monistat OTC, which she did and reports that the monistat helped with the irritation.      History reviewed. No pertinent past medical history. Past Surgical History:  Procedure Laterality Date  . CESAREAN SECTION    . TUBAL LIGATION     History reviewed. No pertinent family history. Social History  Substance Use Topics  . Smoking status: Former Games developermoker  . Smokeless tobacco: Never Used  . Alcohol use No   OB History    No data available     Review of Systems  Constitutional: Negative for chills, fatigue and fever.  Respiratory: Negative for cough and shortness of breath.   Cardiovascular: Negative for chest pain and palpitations.  Gastrointestinal: Positive for abdominal pain. Negative for diarrhea, nausea and vomiting.  Genitourinary: Positive for flank pain, frequency, urgency and vaginal pain. Negative for dyspareunia, dysuria, hematuria, vaginal bleeding and vaginal discharge.    Allergies  Patient has no known allergies.  Home Medications   Prior to Admission medications   Medication Sig Start Date End Date Taking? Authorizing Provider  BELVIQ 10 MG TABS Take 1 tablet by  mouth 2 (two) times daily. 07/02/15   Historical Provider, MD  loratadine (CLARITIN) 10 MG tablet Take 10 mg by mouth daily.    Historical Provider, MD  nitrofurantoin, macrocrystal-monohydrate, (MACROBID) 100 MG capsule Take 1 capsule (100 mg total) by mouth 2 (two) times daily. 03/31/16 04/05/16  Lucia EstelleFeng Tamerra Merkley, NP  Norethin Ace-Eth Estrad-FE (TAYTULLA) 1-20 MG-MCG(24) CAPS Take 1 capsule by mouth daily.    Historical Provider, MD  phentermine (ADIPEX-P) 37.5 MG tablet TAKE 1 TABLET BY MOUTH ONCE DAILY 30 MINUTES BEFORE BREAKFAST 05/29/15   Historical Provider, MD   Meds Ordered and Administered this Visit  Medications - No data to display  BP 119/85 (BP Location: Left Arm)   Pulse 93   Temp 98.1 F (36.7 C) (Oral)   Resp 16   Ht 5\' 4"  (1.626 m)   Wt 215 lb (97.5 kg)   LMP 02/25/2016   SpO2 100%   BMI 36.90 kg/m  No data found.   Physical Exam  Constitutional: She is oriented to person, place, and time. She appears well-developed and well-nourished.  HENT:  Head: Normocephalic and atraumatic.  Cardiovascular: Normal rate, regular rhythm and normal heart sounds.   Pulmonary/Chest: Effort normal and breath sounds normal. No respiratory distress. She has no wheezes.  Abdominal: Soft. Bowel sounds are normal. She exhibits no distension. There is no tenderness.  Reports pressure at the lower mid abdomen  Genitourinary:  Genitourinary Comments: Labia majora and minora unremarkable and no lesion. Vaginal canal pink and moist with small amt of white thin discharge without odor. Cervix low, firm, no lesion. Has stage 1-2 uterine prolapse, -CMT, -adenxal  tenderness, -uterine tenderness.   Neurological: She is alert and oriented to person, place, and time.  Skin: Skin is warm and dry.  Nursing note and vitals reviewed.   Urgent Care Course   Clinical Course     Procedures (including critical care time)  Labs Review Labs Reviewed  WET PREP, GENITAL - Abnormal; Notable for the  following:       Result Value   WBC, Wet Prep HPF POC FEW (*)    All other components within normal limits  URINALYSIS COMPLETEWITH MICROSCOPIC ([facility] ONLY) - Abnormal; Notable for the following:    APPearance HAZY (*)    Protein, ur TRACE (*)    Leukocytes, UA SMALL (*)    Bacteria, UA MANY (*)    Squamous Epithelial / LPF 6-30 (*)    All other components within normal limits  CHLAMYDIA/NGC RT PCR ([facility] ONLY)  URINE CULTURE    Imaging Review No results found.  MDM   1. Urinary frequency   2. Urinary urgency   3. Lower abdominal pain   4. Right flank pain    1) Wet prep shows few WBC. Negative clue cell, negative yeast and neg trich. GC/CT pending.  2) UA shows trace protein, small Leukocytes, many bacteria. UTI high in differential. Will tx for UTI today with Macrobid BID x 5 days. Informed to f/u with PCP in 2 weeks after her symptoms resolved to have her urine recheck due to presence of proteinuria today.    Lucia EstelleFeng Hiroyuki Ozanich, NP 03/31/16 1447

## 2016-04-03 ENCOUNTER — Telehealth: Payer: Self-pay | Admitting: *Deleted

## 2016-04-03 LAB — URINE CULTURE

## 2016-04-03 NOTE — Telephone Encounter (Signed)
Called patient, verified DOB, communicated inconclusive urine culture result. Patient reported taking prescribed antibiotic and feeling much better. Advised patient to follow up with PCP or [facility] if symptoms persist.

## 2016-07-08 ENCOUNTER — Ambulatory Visit: Payer: Self-pay | Admitting: Advanced Practice Midwife

## 2016-07-09 ENCOUNTER — Ambulatory Visit (INDEPENDENT_AMBULATORY_CARE_PROVIDER_SITE_OTHER): Payer: BLUE CROSS/BLUE SHIELD | Admitting: Obstetrics & Gynecology

## 2016-07-09 VITALS — BP 100/60 | Ht 64.0 in | Wt 268.0 lb

## 2016-07-09 DIAGNOSIS — E6609 Other obesity due to excess calories: Secondary | ICD-10-CM | POA: Diagnosis not present

## 2016-07-09 DIAGNOSIS — N3001 Acute cystitis with hematuria: Secondary | ICD-10-CM

## 2016-07-09 DIAGNOSIS — N914 Secondary oligomenorrhea: Secondary | ICD-10-CM | POA: Diagnosis not present

## 2016-07-09 DIAGNOSIS — R300 Dysuria: Secondary | ICD-10-CM

## 2016-07-09 DIAGNOSIS — Z6841 Body Mass Index (BMI) 40.0 and over, adult: Secondary | ICD-10-CM

## 2016-07-09 DIAGNOSIS — IMO0001 Reserved for inherently not codable concepts without codable children: Secondary | ICD-10-CM

## 2016-07-09 LAB — POCT URINALYSIS DIPSTICK
KETONES UA: NEGATIVE
NITRITE UA: NEGATIVE
PROTEIN UA: NEGATIVE
Spec Grav, UA: 1.01
Urobilinogen, UA: NEGATIVE
pH, UA: 6

## 2016-07-09 MED ORDER — PHENTERMINE HCL 37.5 MG PO TABS: ORAL_TABLET | ORAL | 0 refills | Status: DC

## 2016-07-09 MED ORDER — SULFAMETHOXAZOLE-TRIMETHOPRIM 800-160 MG PO TABS
1.0000 | ORAL_TABLET | Freq: Two times a day (BID) | ORAL | 1 refills | Status: DC
Start: 2016-07-09 — End: 2017-12-17

## 2016-07-09 MED ORDER — NORGESTIMATE-ETH ESTRADIOL 0.25-35 MG-MCG PO TABS: 1.0000 | ORAL_TABLET | Freq: Every day | ORAL | 11 refills | Status: DC

## 2016-07-09 NOTE — Progress Notes (Signed)
Is there a question?

## 2016-07-09 NOTE — Progress Notes (Signed)
HPI:      Donna Fletcher is a 26 y.o. G2P2 who LMP was 3 mos ago after Provera challenge, presents today for a problem visit.    Urinary Tract Infection: Patient complains of dysuria and urgency . She has had symptoms for 3 days. Patient also complains of back pain. Patient denies fever and vaginal discharge. Patient does have a history of recurrent UTI.  Patient does not have a history of pyelonephritis.   Also has no periods now, except w Provera.  Tried OCP (Taytulla) but too expensive. Also concerned over continued weight gain.  Has tried meds in past w some success.  PMHx: She  has no past medical history on file. Also,  has a past surgical history that includes Cesarean section and Tubal ligation., family history is not on file.,  reports that she has quit smoking. She has never used smokeless tobacco. She reports that she does not drink alcohol or use drugs.  She has a current medication list which includes the following prescription(s): loratadine, norgestimate-ethinyl estradiol, phentermine, and sulfamethoxazole-trimethoprim. Also, has No Known Allergies.  Review of Systems  Constitutional: Negative for chills, fever and malaise/fatigue.  HENT: Negative for congestion, sinus pain and sore throat.   Eyes: Negative for blurred vision and pain.  Respiratory: Negative for cough and wheezing.   Cardiovascular: Negative for chest pain and leg swelling.  Gastrointestinal: Negative for abdominal pain, constipation, diarrhea, heartburn, nausea and vomiting.  Genitourinary: Negative for dysuria, frequency, hematuria and urgency.  Musculoskeletal: Negative for back pain, joint pain, myalgias and neck pain.  Skin: Negative for itching and rash.  Neurological: Negative for dizziness, tremors and weakness.  Endo/Heme/Allergies: Does not bruise/bleed easily.  Psychiatric/Behavioral: Negative for depression. The patient is not nervous/anxious and does not have insomnia.      Objective: Vitals:   07/09/16 1512  BP: 100/60   Physical Exam  Constitutional: She is oriented to person, place, and time. She appears well-developed and well-nourished. No distress.  Genitourinary: Rectum normal and vagina normal. Pelvic exam was performed with patient supine. There is no rash or lesion on the right labia. There is no rash or lesion on the left labia. Vagina exhibits no lesion. No bleeding in the vagina.  Cardiovascular: Normal rate.   Pulmonary/Chest: Effort normal.  Abdominal: Soft. Bowel sounds are normal. She exhibits no distension. There is no tenderness. There is no rebound.  Musculoskeletal: Normal range of motion.  Neurological: She is alert and oriented to person, place, and time.  Skin: Skin is warm and dry.  Psychiatric: She has a normal mood and affect.  Vitals reviewed.   Results for orders placed or performed in visit on 07/09/16  POCT urinalysis dipstick  Result Value Ref Range   Color, UA yellow    Clarity, UA clear    Glucose, UA ned    Bilirubin, UA ned    Ketones, UA neg    Spec Grav, UA 1.010    Blood, UA moderate    pH, UA 6.0    Protein, UA neg    Urobilinogen, UA negative    Nitrite, UA neg    Leukocytes, UA 4+ (A) Negative    ASSESSMENT/PLAN:   Acute cystitis Dysuria - Plan: POCT urinalysis dipstick, Urine culture, sulfamethoxazole-trimethoprim (BACTRIM DS,SEPTRA DS) 800-160 MG tablet  Acute cystitis with hematuria - Plan: Urine culture, sulfamethoxazole-trimethoprim (BACTRIM DS,SEPTRA DS) 800-160 MG tablet  Class 3 obesity due to excess calories without serious comorbidity with body mass index (BMI) of  45.0 to 49.9 in adult [facility](HCC) - Plan: phentermine (ADIPEX-P) 37.5 MG tablet, norgestimate-ethinyl estradiol (ORTHO-CYCLEN,SPRINTEC,PREVIFEM) 0.25-35 MG-MCG tablet  Secondary oligomenorrhea - Plan: norgestimate-ethinyl estradiol (ORTHO-CYCLEN,SPRINTEC,PREVIFEM) 0.25-35 MG-MCG tablet  F/u 1 month  Annamarie MajorPaul Mory Herrman, MD,  Merlinda FrederickFACOG [facility], [facility] 07/09/2016  3:59 PM

## 2016-07-09 NOTE — Patient Instructions (Signed)

## 2016-07-11 LAB — URINE CULTURE: ORGANISM ID, BACTERIA: NO GROWTH

## 2016-07-27 ENCOUNTER — Ambulatory Visit: Payer: Self-pay | Admitting: Advanced Practice Midwife

## 2016-08-11 ENCOUNTER — Encounter: Payer: Self-pay | Admitting: Obstetrics & Gynecology

## 2016-08-16 ENCOUNTER — Ambulatory Visit: Payer: BLUE CROSS/BLUE SHIELD | Admitting: Obstetrics & Gynecology

## 2016-09-13 ENCOUNTER — Ambulatory Visit: Payer: BLUE CROSS/BLUE SHIELD | Admitting: Obstetrics & Gynecology

## 2016-10-25 ENCOUNTER — Ambulatory Visit
Admission: EM | Admit: 2016-10-25 | Discharge: 2016-10-25 | Disposition: A | Discharge status: Home / Self Care | Payer: Self-pay | Attending: Emergency Medicine | Admitting: Emergency Medicine

## 2016-10-25 ENCOUNTER — Encounter: Payer: Self-pay | Admitting: *Deleted

## 2016-10-25 DIAGNOSIS — R21 Rash and other nonspecific skin eruption: Secondary | ICD-10-CM

## 2016-10-25 DIAGNOSIS — B029 Zoster without complications: Secondary | ICD-10-CM

## 2016-10-25 MED ORDER — VALACYCLOVIR HCL 1 G PO TABS: 1000.0000 mg | ORAL_TABLET | Freq: Three times a day (TID) | ORAL | 0 refills | Status: AC

## 2016-10-25 MED ORDER — PREDNISONE 10 MG PO TABS: ORAL_TABLET | ORAL | 0 refills | Status: DC

## 2016-10-25 NOTE — ED Provider Notes (Signed)
[facility] ____________________________________________  Time seen: Approximately 0914 AM  I have reviewed the triage vital signs and the nursing notes.   HISTORY  Chief Complaint Rash  HPI Donna Fletcher is a 26 y.o. female presenting for evaluation of rash present to her chest that is in there for the last week. Patient reports having area to right and left medial chest 1 week, but reports she had 2 new areas this morning. States the rash is burning and itching. Denies any drainage. Reports other than new areas coming up today the rash has otherwise not changed over the last week. Denies any radiation of discomfort, paresthesias, injury or trauma. Denies insect bites, tick bite or tick attachment. Denies fevers. Denies any other rash or skin changes. Denies any changes in foods, medicines, lotions, detergents or contacts. Denies known trigger. Denies any aggravating or alleviating factors. States tried over-the-counter anti-itch cream, triamcinolone cream cream that she had at home and Benadryl without any change. Patient reports that she does sleep on her side which causes her breast to touch together the same area. Reports did have chickenpox as a child. Denies rash to breasts.   Denies chest pain, shortness of breath, abdominal pain, dysuria, extremity pain, extremity swelling or rash. Denies recent sickness. Denies recent antibiotic use.   Patient's last menstrual period was 10/23/2016. Denies pregnancy [place]: PCP   History reviewed. No pertinent past medical history. Denies There are no active problems to display for this patient.   Past Surgical History:  Procedure Laterality Date  . CESAREAN SECTION    . TUBAL LIGATION       No current facility-administered medications for this encounter.   Current Outpatient Prescriptions:  .  norgestimate-ethinyl estradiol (ORTHO-CYCLEN,SPRINTEC,PREVIFEM) 0.25-35 MG-MCG tablet, Take 1 tablet by  mouth daily., Disp: 1 Package, Rfl: 11 .  loratadine (CLARITIN) 10 MG tablet, Take 10 mg by mouth daily., Disp: , Rfl:  .  phentermine (ADIPEX-P) 37.5 MG tablet, TAKE 1 TABLET BY MOUTH ONCE DAILY 30 MINUTES BEFORE BREAKFAST, Disp: 30 tablet, Rfl: 0 .  predniSONE (DELTASONE) 10 MG tablet, Start 60 mg po day one, then 50 mg po day two, taper by 10 mg daily until complete., Disp: 21 tablet, Rfl: 0 .  sulfamethoxazole-trimethoprim (BACTRIM DS,SEPTRA DS) 800-160 MG tablet, Take 1 tablet by mouth 2 (two) times daily., Disp: 14 tablet, Rfl: 1 .  valACYclovir (VALTREX) 1000 MG tablet, Take 1 tablet (1,000 mg total) by mouth 3 (three) times daily., Disp: 21 tablet, Rfl: 0  Allergies Patient has no known allergies.  Family History  Problem Relation Age of Onset  . Hypertension Mother   . Ovarian cancer Mother [id number]  . Uterine cancer Mother   . Hypertension Father   . Diabetes Brother   . Hypertension Brother   . Diabetes Maternal Grandmother   . Hypertension Maternal Grandmother   . Hypertension Maternal Grandfather   . Hypertension Paternal Grandmother   . Hypertension Paternal Grandfather     Social History Social History  Substance Use Topics  . Smoking status: Former Games developermoker  . Smokeless tobacco: Never Used  . Alcohol use No    Review of Systems Constitutional: No fever/chills Eyes: No visual changes. ENT: No sore throat. Cardiovascular: Denies chest pain. Respiratory: Denies shortness of breath. Gastrointestinal: No abdominal pain.  No nausea, no vomiting.  No diarrhea.  No constipation. Genitourinary: Negative for dysuria. Musculoskeletal: Negative for back pain. Skin: Positive for rash. Neurological: Negative for headaches, focal weakness or  numbness.   ____________________________________________   PHYSICAL EXAM:  VITAL SIGNS: ED Triage Vitals  Enc Vitals Group     BP 10/25/16 0853 (!) 90/53     Pulse Rate 10/25/16 0853 88     Resp 10/25/16 0853 16     Temp 10/25/16  0853 98.6 F (37 C)     Temp Source 10/25/16 0853 Oral     SpO2 10/25/16 0853 99 %     Weight 10/25/16 0855 220 lb (99.8 kg)     Height 10/25/16 0855 5\' 5"  (1.651 m)     Head Circumference --      Peak Flow --      Pain Score 10/25/16 0856 4     Pain Loc --      Pain Edu? --      Excl. in GC? --     Constitutional: Alert and oriented. Well appearing and in no acute distress. ENT      Head: Normocephalic and atraumatic.      Mouth/Throat: Mucous membranes are moist.Oropharynx non-erythematous. Cardiovascular: Normal rate, regular rhythm. Grossly normal heart sounds.  Good peripheral circulation. Respiratory: Normal respiratory effort without tachypnea nor retractions. Breath sounds are clear and equal bilaterally. No wheezes, rales, rhonchi. Musculoskeletal: No midline cervical, thoracic or lumbar tenderness to palpation. Neurologic:  Normal speech and language.  Speech is normal. No gait instability.  Skin:  Skin is warm, dry. Except: Right and left medial chest times four erythematous base patches of vesicular lesions, no surrounding erythema, nontender, no drainage, no fluctuance, no punctum, non-excoriated, no other skin changes noted, 2 lesions to each side of midline, no honey crusted lesions or drainage. Psychiatric: Mood and affect are normal. Speech and behavior are normal. Patient exhibits appropriate insight and judgment   ___________________________________________   LABS (all labs ordered are listed, but only abnormal results are displayed)  Labs Reviewed - No data to display ____________________________________________  PROCEDURES Procedures     INITIAL IMPRESSION / ASSESSMENT AND PLAN / ED COURSE  Pertinent labs & imaging results that were available during my care of the patient were reviewed by me and considered in my medical decision making (see chart for details).  Very well-appearing patient. No acute distress. Patient with vesicular lesions present to  chest. Clinical appearance consistent with shingles. Discussed the patient rash does cross midline, but with strong appearance of shingles, suspect shingles rash. Discussed with patient to continue to monitor closely, avoid rubbing and touching. Keep clean. Will treat patient with prednisone taper and Valtrex 1 week. Discussed strict follow-up and return parameters.Discussed indication, risks and benefits of medications with patient.  Discussed follow up with Primary care physician this week. Discussed follow up and return parameters including no resolution or any worsening concerns. Patient verbalized understanding and agreed to plan.   ____________________________________________   FINAL CLINICAL IMPRESSION(S) / ED DIAGNOSES  Final diagnoses:  Herpes zoster without complication  Rash     Discharge Medication List as of 10/25/2016  9:33 AM    START taking these medications   Details  predniSONE (DELTASONE) 10 MG tablet Start 60 mg po day one, then 50 mg po day two, taper by 10 mg daily until complete., Normal    valACYclovir (VALTREX) 1000 MG tablet Take 1 tablet (1,000 mg total) by mouth 3 (three) times daily., Starting Mon 10/25/2016, Until Mon 11/01/2016, Normal        Note: This dictation was prepared with Dragon dictation along with smaller phrase technology. Any transcriptional errors that  result from this process are unintentional.         Renford Dills, NP 10/25/16 1105

## 2016-10-25 NOTE — ED Triage Notes (Signed)
Patient started having symptom of rash on her right upper chest 1 week ago and additional rash is now present on her left upper chest. No previous history of skin conditions.

## 2016-10-25 NOTE — Discharge Instructions (Signed)
Take medication as prescribed. Rest. Drink plenty of fluids. Keep clean. Avoid rubbing.   Follow up with your primary care physician this week as needed. Return to Urgent care for new or worsening concerns.

## 2016-11-16 ENCOUNTER — Ambulatory Visit: Payer: BLUE CROSS/BLUE SHIELD | Admitting: Obstetrics and Gynecology

## 2016-12-16 ENCOUNTER — Ambulatory Visit: Payer: BLUE CROSS/BLUE SHIELD | Admitting: Obstetrics & Gynecology

## 2016-12-27 ENCOUNTER — Ambulatory Visit (INDEPENDENT_AMBULATORY_CARE_PROVIDER_SITE_OTHER): Payer: Medicaid Other | Admitting: Obstetrics & Gynecology

## 2016-12-27 ENCOUNTER — Encounter: Payer: Self-pay | Admitting: Obstetrics & Gynecology

## 2016-12-27 VITALS — BP 120/80 | HR 69 | Wt 273.0 lb

## 2016-12-27 DIAGNOSIS — Z0000 Encounter for general adult medical examination without abnormal findings: Secondary | ICD-10-CM

## 2016-12-27 DIAGNOSIS — IMO0001 Reserved for inherently not codable concepts without codable children: Secondary | ICD-10-CM

## 2016-12-27 DIAGNOSIS — Z309 Encounter for contraceptive management, unspecified: Secondary | ICD-10-CM

## 2016-12-27 DIAGNOSIS — Z124 Encounter for screening for malignant neoplasm of cervix: Secondary | ICD-10-CM

## 2016-12-27 DIAGNOSIS — N914 Secondary oligomenorrhea: Secondary | ICD-10-CM

## 2016-12-27 MED ORDER — NORGESTIMATE-ETH ESTRADIOL 0.25-35 MG-MCG PO TABS: 1.0000 | ORAL_TABLET | Freq: Every day | ORAL | 3 refills | Status: DC

## 2016-12-27 MED ORDER — PHENTERMINE HCL 37.5 MG PO TABS: ORAL_TABLET | ORAL | 0 refills | Status: DC

## 2016-12-27 NOTE — Patient Instructions (Signed)

## 2016-12-27 NOTE — Progress Notes (Signed)
HPI:      Ms. Donna Fletcher is a 26 y.o. [id number] who LMP was Patient's last menstrual period was 11/29/2016., she presents today for her annual examination. The patient has no complaints today. The patient is sexually active. Her last pap: was normal. The patient does perform self breast exams.  There is no notable family history of breast or ovarian cancer in her family.  The patient has regular exercise: yes.  The patient denies current symptoms of depression.    GYN History: Contraception: tubal ligation  PMHx: No past medical history on file. Past Surgical History:  Procedure Laterality Date  . CESAREAN SECTION    . TUBAL LIGATION     Family History  Problem Relation Age of Onset  . Hypertension Mother   . Ovarian cancer Mother 31  . Uterine cancer Mother 91  . Hypertension Father   . Diabetes Brother   . Hypertension Brother   . Diabetes Maternal Grandmother   . Hypertension Maternal Grandmother   . Hypertension Maternal Grandfather   . Hypertension Paternal Grandmother   . Hypertension Paternal Grandfather    Social History  Substance Use Topics  . Smoking status: Former Games developer  . Smokeless tobacco: Never Used  . Alcohol use No    Current Outpatient Prescriptions:  .  norgestimate-ethinyl estradiol (ORTHO-CYCLEN,SPRINTEC,PREVIFEM) 0.25-35 MG-MCG tablet, Take 1 tablet by mouth daily., Disp: 3 Package, Rfl: 3 .  loratadine (CLARITIN) 10 MG tablet, Take 10 mg by mouth daily., Disp: , Rfl:  .  phentermine (ADIPEX-P) 37.5 MG tablet, TAKE 1 TABLET BY MOUTH ONCE DAILY 30 MINUTES BEFORE BREAKFAST, Disp: 30 tablet, Rfl: 0 .  predniSONE (DELTASONE) 10 MG tablet, Start 60 mg po day one, then 50 mg po day two, taper by 10 mg daily until complete. (Patient not taking: Reported on 12/27/2016), Disp: 21 tablet, Rfl: 0 .  sulfamethoxazole-trimethoprim (BACTRIM DS,SEPTRA DS) 800-160 MG tablet, Take 1 tablet by mouth 2 (two) times daily. (Patient not taking: Reported on 12/27/2016),  Disp: 14 tablet, Rfl: 1 Allergies: Patient has no known allergies.  Review of Systems  Constitutional: Negative for chills, fever and malaise/fatigue.  HENT: Negative for congestion, sinus pain and sore throat.   Eyes: Negative for blurred vision and pain.  Respiratory: Negative for cough and wheezing.   Cardiovascular: Negative for chest pain and leg swelling.  Gastrointestinal: Negative for abdominal pain, constipation, diarrhea, heartburn, nausea and vomiting.  Genitourinary: Negative for dysuria, frequency, hematuria and urgency.  Musculoskeletal: Negative for back pain, joint pain, myalgias and neck pain.  Skin: Negative for itching and rash.  Neurological: Negative for dizziness, tremors and weakness.  Endo/Heme/Allergies: Does not bruise/bleed easily.  Psychiatric/Behavioral: Negative for depression. The patient is not nervous/anxious and does not have insomnia.     Objective: BP 120/80   Pulse 69   Wt 273 lb (123.8 kg)   LMP 11/29/2016   BMI 45.43 kg/m   Filed Weights   12/27/16 1103  Weight: 273 lb (123.8 kg)   Body mass index is 45.43 kg/m. Physical Exam  Constitutional: She is oriented to person, place, and time. She appears well-developed and well-nourished. No distress.  Genitourinary: Rectum normal, vagina normal and uterus normal. Pelvic exam was performed with patient supine. There is no rash or lesion on the right labia. There is no rash or lesion on the left labia. Vagina exhibits no lesion. No bleeding in the vagina. Right adnexum does not display mass and does not display tenderness. Left adnexum does  not display mass and does not display tenderness. Cervix does not exhibit motion tenderness, lesion, friability or polyp.   Uterus is mobile and midaxial. Uterus is not enlarged or exhibiting a mass.  HENT:  Head: Normocephalic and atraumatic. Head is without laceration.  Right Ear: Hearing normal.  Left Ear: Hearing normal.  Nose: No epistaxis.  No foreign  bodies.  Mouth/Throat: Uvula is midline, oropharynx is clear and moist and mucous membranes are normal.  Eyes: Pupils are equal, round, and reactive to light.  Neck: Normal range of motion. Neck supple. No thyromegaly present.  Cardiovascular: Normal rate and regular rhythm.  Exam reveals no gallop and no friction rub.   No murmur heard. Pulmonary/Chest: Effort normal and breath sounds normal. No respiratory distress. She has no wheezes. Right breast exhibits no mass, no skin change and no tenderness. Left breast exhibits no mass, no skin change and no tenderness.  Abdominal: Soft. Bowel sounds are normal. She exhibits no distension. There is no tenderness. There is no rebound.  Musculoskeletal: Normal range of motion.  Neurological: She is alert and oriented to person, place, and time. No cranial nerve deficit.  Skin: Skin is warm and dry.  Psychiatric: She has a normal mood and affect. Judgment normal.  Vitals reviewed.   Assessment: 1. Annual physical exam   2. Morbid obesity (HCC)   3. Screening for cervical cancer   4. Class 3 obesity due to excess calories without serious comorbidity with body mass index (BMI) of 45.0 to 49.9 in adult (HCC)   5. Secondary oligomenorrhea    Plan:            1.  Cervical Screening-  Pap smear done today  2. Breast screening- Exam annually and mammogram>40 planned   3. Colonoscopy every 10 years, Hemoccult testing - after age 66  4. Labs managed by PCP  5. Counseling for contraception: bilateral tubal ligation, also on OCPs to reg cycles and it does help w dysmenorrhea   6. Morbid obesity (HCC) -Lifestyle measures discussed.  Meds also discussed.  Phentermine Rx and follow up    F/U  Return in about 1 week (around 01/03/2017) for Follow up.  Annamarie Major, MD, [facility], [facility] 12/27/2016  11:22 AM

## 2016-12-28 LAB — IGP, RFX APTIMA HPV ASCU: PAP Smear Comment: 0

## 2016-12-30 ENCOUNTER — Encounter: Payer: Self-pay | Admitting: Obstetrics and Gynecology

## 2017-01-31 ENCOUNTER — Ambulatory Visit (INDEPENDENT_AMBULATORY_CARE_PROVIDER_SITE_OTHER): Payer: Medicaid Other | Admitting: Obstetrics & Gynecology

## 2017-01-31 ENCOUNTER — Encounter: Payer: Self-pay | Admitting: Obstetrics & Gynecology

## 2017-01-31 DIAGNOSIS — E6609 Other obesity due to excess calories: Secondary | ICD-10-CM

## 2017-01-31 DIAGNOSIS — IMO0001 Reserved for inherently not codable concepts without codable children: Secondary | ICD-10-CM

## 2017-01-31 DIAGNOSIS — Z6841 Body Mass Index (BMI) 40.0 and over, adult: Secondary | ICD-10-CM | POA: Diagnosis not present

## 2017-01-31 MED ORDER — PHENTERMINE HCL 37.5 MG PO TABS: ORAL_TABLET | ORAL | 0 refills | Status: DC

## 2017-01-31 NOTE — Progress Notes (Signed)
  History of Present Illness:  Donna Fletcher is a 26 y.o. who was started on Phentermine approximately 4 weeks ago due to obesity/abnormal weight gain. The patient has lost 3 pounds over the past month due to meds and lifestyle changes (exercise daily am)..   She has these side effects: dry mouth and insomnia.  Lost a pant size and feels good overall  PMHx: She  has a past medical history of Chlamydia (2012, 2014); Family history of ovarian cancer; and Obesity. Also,  has a past surgical history that includes Cesarean section and Tubal ligation., family history includes Diabetes in her brother and maternal grandmother; Hypertension in her brother, father, maternal grandfather, maternal grandmother, mother, paternal grandfather, and paternal grandmother; Ovarian cancer (age of onset: 65) in her mother; Uterine cancer (age of onset: 51) in her mother.,  reports that she has quit smoking. She has never used smokeless tobacco. She reports that she does not drink alcohol or use drugs.  She has a current medication list which includes the following prescription(s): loratadine, norgestimate-ethinyl estradiol, phentermine, prednisone, and sulfamethoxazole-trimethoprim. Also, has No Known Allergies.  Review of Systems  All other systems reviewed and are negative.   Physical Exam:  BP 130/80   Pulse 94   Ht 5' 4.5" (1.638 m)   Wt 270 lb (122.5 kg)   LMP 12/29/2016   BMI 45.63 kg/m  Body mass index is 45.63 kg/m. Filed Weights   01/31/17 0826  Weight: 270 lb (122.5 kg)    Physical Exam  Constitutional: She is oriented to person, place, and time. She appears well-developed and well-nourished. No distress.  Musculoskeletal: Normal range of motion.  Neurological: She is alert and oriented to person, place, and time.  Skin: Skin is warm and dry.  Psychiatric: She has a normal mood and affect.  Vitals reviewed.   Assessment: morbid obesity Medication treatment is going well for  her.  Plan: Patient is continued/added to prescription appetite suppressants: PHENTERMINE.   Will continue to assist patient in incorporating positive experiences into her life to promote a positive mental attitude.  Education given regarding appropriate lifestyle changes for weight loss, including regular physical activity, healthy coping strategies, caloric restriction, and healthy eating patterns.  The risks and benefits as well as side effects of medication, such as Phenteramine or Tenuate, is discussed.  The pros and cons of suppressing appetite and boosting metabolism is counseled.  Risks of tolerance and addiction discussed.  Use of medicine will be short term.  Pt to call with any negative side effects and agrees to keep follow up appointments.  Monitor for continued insomnia, may need to adjust meds  A total of 15 minutes were spent face-to-face with the patient during this encounter and over half of that time dealt with counseling and coordination of care.  Annamarie Major, MD, [facility], [facility] 01/31/2017  8:34 AM

## 2017-03-02 ENCOUNTER — Ambulatory Visit: Payer: Medicaid Other | Admitting: Obstetrics & Gynecology

## 2017-03-28 ENCOUNTER — Ambulatory Visit: Payer: Medicaid Other | Admitting: Obstetrics & Gynecology

## 2017-12-17 ENCOUNTER — Ambulatory Visit (INDEPENDENT_AMBULATORY_CARE_PROVIDER_SITE_OTHER): Payer: BLUE CROSS/BLUE SHIELD

## 2017-12-17 ENCOUNTER — Encounter: Payer: Self-pay | Admitting: Gynecology

## 2017-12-17 ENCOUNTER — Ambulatory Visit
Admission: EM | Admit: 2017-12-17 | Discharge: 2017-12-17 | Disposition: A | Discharge status: Home / Self Care | Payer: BLUE CROSS/BLUE SHIELD | Attending: Family Medicine | Admitting: Family Medicine

## 2017-12-17 ENCOUNTER — Other Ambulatory Visit: Payer: Self-pay

## 2017-12-17 DIAGNOSIS — R109 Unspecified abdominal pain: Secondary | ICD-10-CM

## 2017-12-17 DIAGNOSIS — R300 Dysuria: Secondary | ICD-10-CM | POA: Diagnosis not present

## 2017-12-17 DIAGNOSIS — N201 Calculus of ureter: Secondary | ICD-10-CM | POA: Diagnosis not present

## 2017-12-17 LAB — URINALYSIS, COMPLETE (UACMP) WITH MICROSCOPIC
BACTERIA UA: NONE SEEN
Bilirubin Urine: NEGATIVE
Glucose, UA: NEGATIVE mg/dL
Ketones, ur: NEGATIVE mg/dL
Leukocytes, UA: NEGATIVE
Nitrite: NEGATIVE
PH: 7 (ref 5.0–8.0)
Protein, ur: NEGATIVE mg/dL
SPECIFIC GRAVITY, URINE: 1.01 (ref 1.005–1.030)

## 2017-12-17 MED ORDER — TAMSULOSIN HCL 0.4 MG PO CAPS: 0.4000 mg | ORAL_CAPSULE | Freq: Every day | ORAL | 0 refills | Status: DC

## 2017-12-17 MED ORDER — KETOROLAC TROMETHAMINE 60 MG/2ML IM SOLN
60.0000 mg | Freq: Once | INTRAMUSCULAR | Status: AC
  Administered 2017-12-17: 60 mg via INTRAMUSCULAR

## 2017-12-17 NOTE — ED Triage Notes (Signed)
Patient c/o frequent urination and back pain x 3 days.

## 2017-12-17 NOTE — ED Provider Notes (Signed)
[facility] ____________________________________________  Time seen: Approximately 1:10 PM  I have reviewed the triage vital signs and the nursing notes.   HISTORY  Chief Complaint Urinary Tract Infection   HPI Donna Fletcher is a 27 y.o. female presenting for evaluation of left flank pain present for the last 4 days, which she reports is worse with movements but present at all times.  States over-the-counter Tylenol has not relieved pain.  Also reports of the last 2 days due to some urinary frequency, but also further states she does drink a lot of water.  Denies burning with urination, vaginal discharge, rash or lesions.  Denies any vaginal complaints.  Denies abdominal pain.  States that she does occasionally have some vaginal spotting over the last week, and reports that last month she started a new oral contraceptive that was a 8430-month cycle pack.  Denies any concerns of pregnancy.  Reports previous tubal ligation and no recent sexual activity.  Denies other aggravating or alleviating factors.  Reports otherwise feels well. Denies recent sickness. Denies recent antibiotic use.  [place]: PCP   Past Medical History:  Diagnosis Date  . Chlamydia 2012, 2014  . Family history of ovarian cancer    genetic testing letter sent 8/18  . Obesity     Patient Active Problem List   Diagnosis Date Noted  . BMI 45.0-49.9, adult (HCC) 01/31/2017  . Morbid obesity (HCC) 12/27/2016    Past Surgical History:  Procedure Laterality Date  . CESAREAN SECTION    . TUBAL LIGATION       No current facility-administered medications for this encounter.   Current Outpatient Medications:  .  loratadine (CLARITIN) 10 MG tablet, Take 10 mg by mouth daily., Disp: , Rfl:  .  Multiple Vitamin (MULTIVITAMIN) tablet, Take 1 tablet by mouth daily., Disp: , Rfl:  .  norgestimate-ethinyl estradiol (ORTHO-CYCLEN,SPRINTEC,PREVIFEM) 0.25-35 MG-MCG tablet, Take 1 tablet by  mouth daily., Disp: 3 Package, Rfl: 3 .  tamsulosin (FLOMAX) 0.4 MG CAPS capsule, Take 1 capsule (0.4 mg total) by mouth daily., Disp: 7 capsule, Rfl: 0  Allergies Patient has no known allergies.  Family History  Problem Relation Age of Onset  . Hypertension Mother   . Ovarian cancer Mother 3330  . Uterine cancer Mother 6753  . Hypertension Father   . Diabetes Brother   . Hypertension Brother   . Diabetes Maternal Grandmother   . Hypertension Maternal Grandmother   . Hypertension Maternal Grandfather   . Hypertension Paternal Grandmother   . Hypertension Paternal Grandfather     Social History Social History   Tobacco Use  . Smoking status: Former Games developermoker  . Smokeless tobacco: Never Used  Substance Use Topics  . Alcohol use: No  . Drug use: No    Review of Systems Constitutional: No fever/chills Cardiovascular: Denies chest pain. Respiratory: Denies shortness of breath. Gastrointestinal: No abdominal pain.   Genitourinary: as above. Musculoskeletal: as above.  Skin: Negative for rash.   ____________________________________________   PHYSICAL EXAM:  VITAL SIGNS: ED Triage Vitals  Enc Vitals Group     BP 12/17/17 1215 112/70     Pulse Rate 12/17/17 1215 78     Resp 12/17/17 1215 18     Temp 12/17/17 1215 97.9 F (36.6 C)     Temp Source 12/17/17 1215 Oral     SpO2 12/17/17 1215 100 %     Weight 12/17/17 1218 230 lb (104.3 kg)     Height 12/17/17 1218  5\' 4"  (1.626 m)     Head Circumference --      Peak Flow --      Pain Score 12/17/17 1218 6     Pain Loc --      Pain Edu? --      Excl. in GC? --     Constitutional: Alert and oriented. Well appearing and in no acute distress. ENT      Head: Normocephalic and atraumatic. Cardiovascular: Normal rate, regular rhythm. Grossly normal heart sounds.  Good peripheral circulation. Respiratory: Normal respiratory effort without tachypnea nor retractions. Breath sounds are clear and equal bilaterally. No wheezes,  rales, rhonchi. Gastrointestinal: Soft and nontender. Obese abdomen. No right CVA tenderness. Mild left CVA tenderness.  Musculoskeletal:   No midline cervical, thoracic or lumbar tenderness to palpation.  Steady gait.  Changes positions quickly in room.  Mild left CVA tenderness and tenderness along left latissimus dorsi, reproduced with direct palpation as well as overhead stretching, full range of motion present. Neurologic:  Normal speech and language. Speech is normal. No gait instability.  Skin:  Skin is warm, dry. Psychiatric: Mood and affect are normal. Speech and behavior are normal. Patient exhibits appropriate insight and judgment   ___________________________________________   LABS (all labs ordered are listed, but only abnormal results are displayed)  Labs Reviewed  URINALYSIS, COMPLETE (UACMP) WITH MICROSCOPIC - Abnormal; Notable for the following components:      Result Value   Color, Urine STRAW (*)    Hgb urine dipstick MODERATE (*)    All other components within normal limits   ____________________________________________  RADIOLOGY  Dg Abdomen 1 View  Result Date: 12/17/2017 CLINICAL DATA:  Left flank pain for the past 4 days. EXAM: ABDOMEN - 1 VIEW COMPARISON:  None. FINDINGS: Normal bowel gas pattern. 3 mm rounded calcification in the inferior left pelvis in the expected position of the distal left ureter. Otherwise, no calcified urinary tract calculi are seen. There is a larger oval calcification with central lucency in the right mid pelvis, possibly representing a small area of calcified fat necrosis. Probable right iliac bone island. IMPRESSION: 3 mm possible distal left ureteral calculus. Electronically Signed   By: Beckie Salts M.D.   On: 12/17/2017 13:30   ____________________________________________   PROCEDURES Procedures    INITIAL IMPRESSION / ASSESSMENT AND PLAN / ED COURSE  Pertinent labs & imaging results that were available during my care of the  patient were reviewed by me and considered in my medical decision making (see chart for details).  Well-appearing patient.  No acute distress.  Urinalysis reviewed and discussed with patient not clear UTI.  Also having left flank pain, appears to be muscular skeletal by exam, however hemoglobin noted in urine.  Discussed patient will evaluate KUB, however also suspicious that hemoglobin in urine is present from contamination of vaginal sources patient is just recently started on a new oral contraceptive and awaiting menstrual regulation vs stone.  KUB reviewed, and per radiology as above 3 mm possible distal left ureteral calculus.  Discussed this with patient.  60 mg IM Toradol given once in urgent care.  Will treat with Flomax.  And discussed with patient likely stone, but suspect patient will be able to soon pass, and continue to increase water intake.  Discussed strict follow-up and return parameters. Urine strainer given. Over-the-counter Tylenol as needed.Discussed indication, risks and benefits of medications with patient.  Discussed follow up with Primary care physician this week. Discussed follow up and return  parameters including no resolution or any worsening concerns. Patient verbalized understanding and agreed to plan.   ____________________________________________   FINAL CLINICAL IMPRESSION(S) / ED DIAGNOSES  Final diagnoses:  Left flank pain  Dysuria     ED Discharge Orders         Ordered    tamsulosin (FLOMAX) 0.4 MG CAPS capsule  Daily     12/17/17 1347           Note: This dictation was prepared with Dragon dictation along with smaller phrase technology. Any transcriptional errors that result from this process are unintentional.         Renford Dills, NP 12/17/17 1407

## 2017-12-17 NOTE — Discharge Instructions (Signed)
Take medication as prescribed. Rest. Drink plenty of fluids. Use strainer.  Follow up with your primary care physician this week as needed. Return to Urgent care for new or worsening concerns.

## 2018-04-19 DIAGNOSIS — F419 Anxiety disorder, unspecified: Secondary | ICD-10-CM | POA: Diagnosis not present

## 2018-04-19 DIAGNOSIS — F329 Major depressive disorder, single episode, unspecified: Secondary | ICD-10-CM | POA: Diagnosis not present

## 2018-06-14 DIAGNOSIS — F419 Anxiety disorder, unspecified: Secondary | ICD-10-CM | POA: Diagnosis not present

## 2018-06-14 DIAGNOSIS — J069 Acute upper respiratory infection, unspecified: Secondary | ICD-10-CM | POA: Diagnosis not present

## 2018-06-14 DIAGNOSIS — R21 Rash and other nonspecific skin eruption: Secondary | ICD-10-CM | POA: Diagnosis not present

## 2018-06-14 DIAGNOSIS — F329 Major depressive disorder, single episode, unspecified: Secondary | ICD-10-CM | POA: Diagnosis not present

## 2018-08-08 DIAGNOSIS — S93402A Sprain of unspecified ligament of left ankle, initial encounter: Secondary | ICD-10-CM | POA: Diagnosis not present

## 2018-12-14 IMAGING — CR DG ABDOMEN 1V
2 series · 2 of 2 positions shown · non-contrast
Comparison: None.

CLINICAL DATA: Left flank pain for the past 4 days.

EXAM:
ABDOMEN - 1 VIEW

[abdomen kub (1 of 2)]
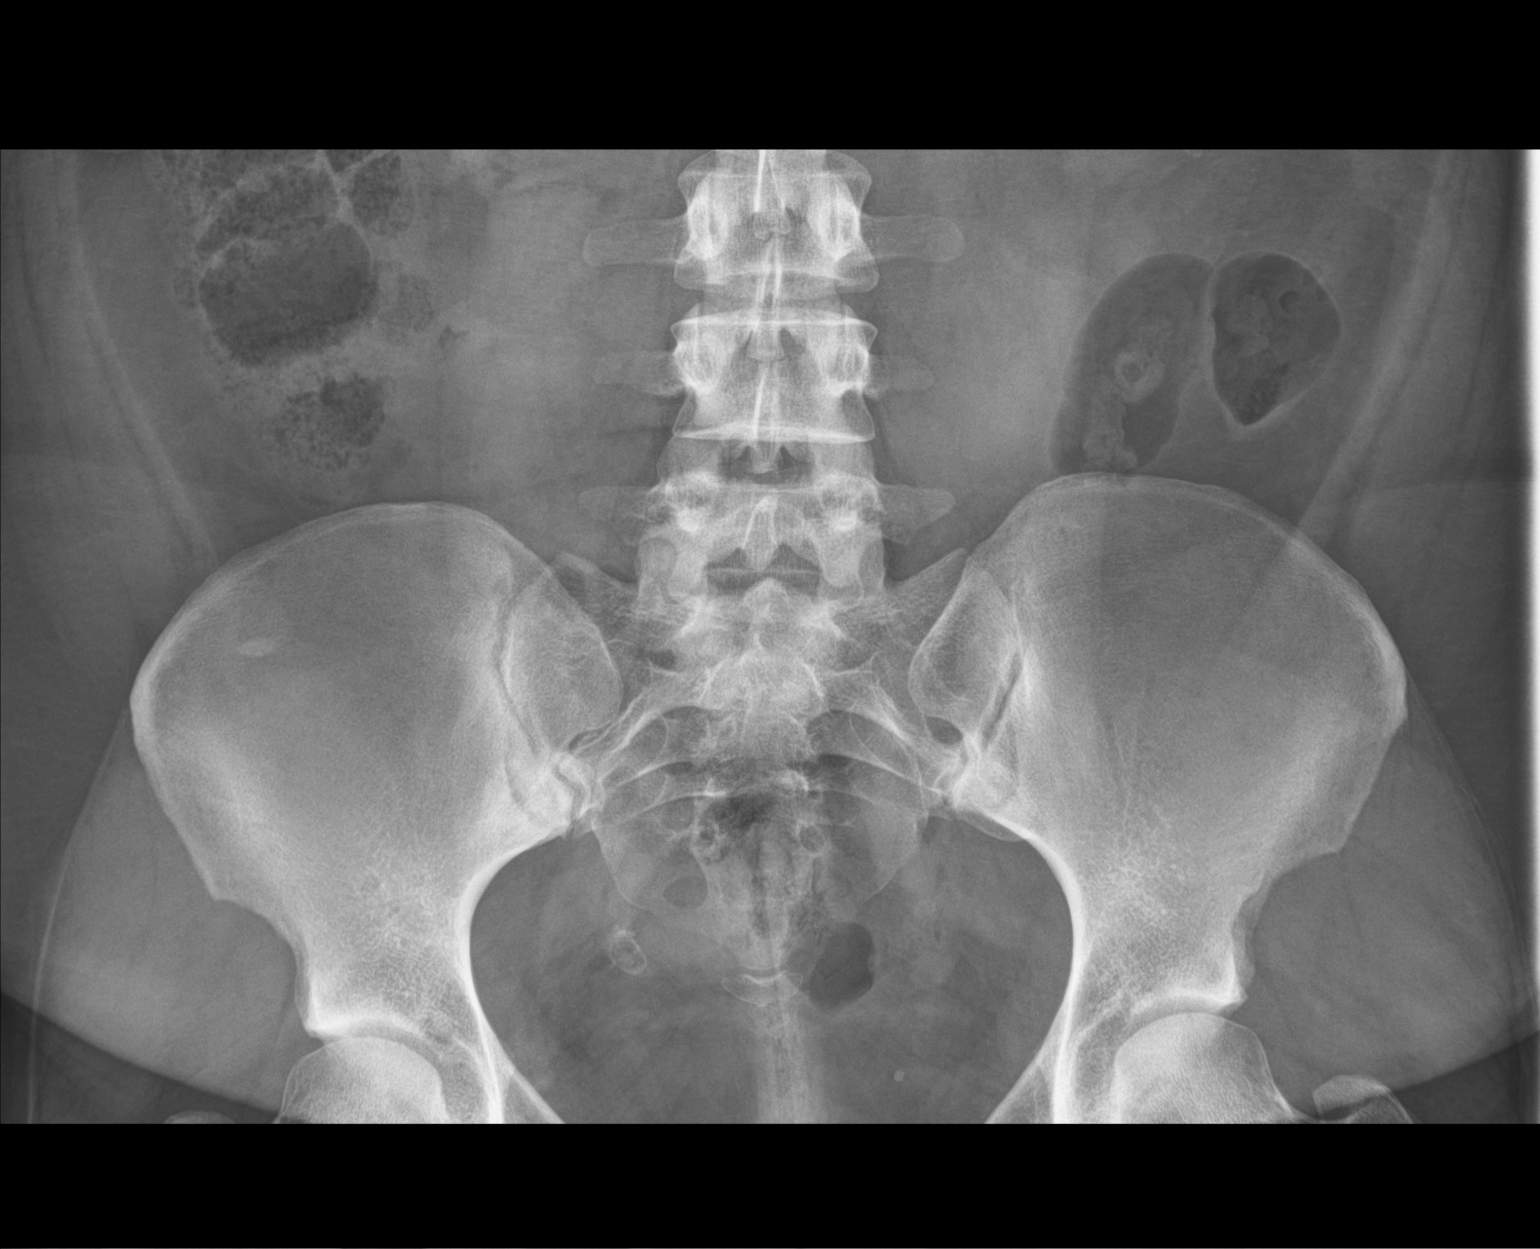

[abdomen kub (2 of 2)]
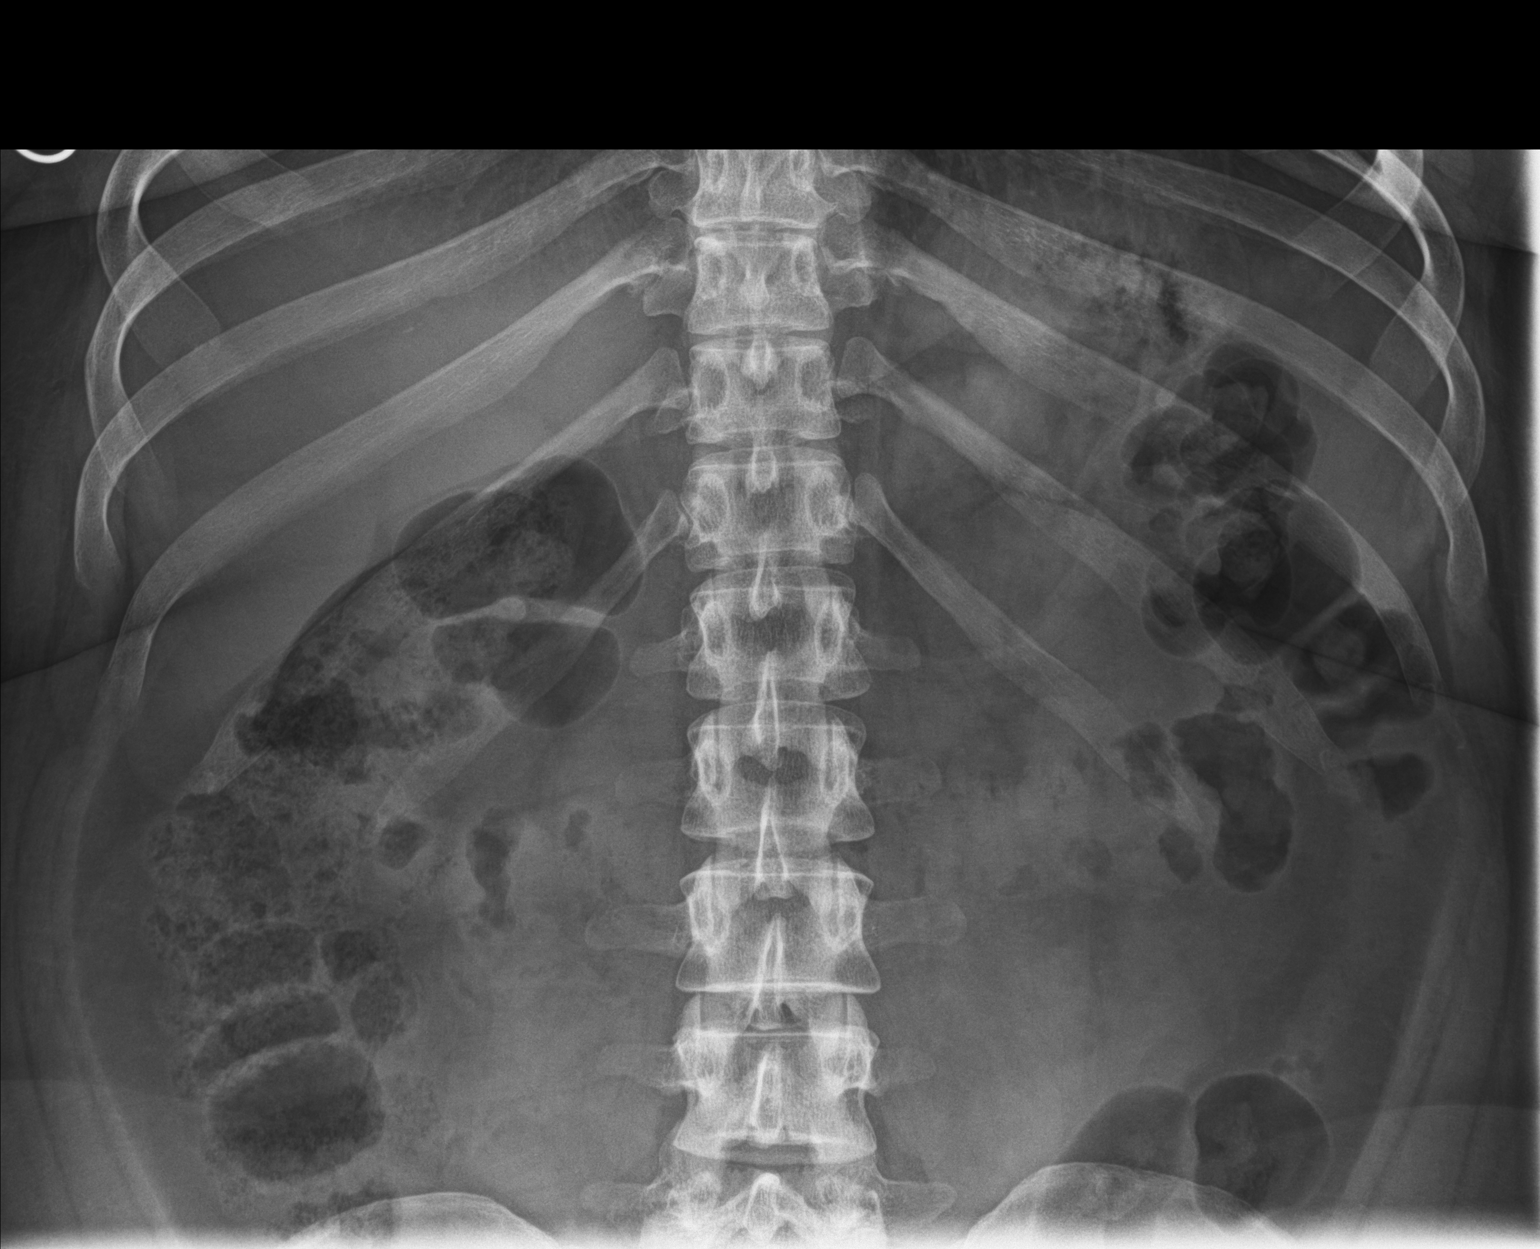

[2 of 2 positions shown; findings below may reference images not displayed]

FINDINGS: Normal bowel gas pattern. 3 mm rounded calcification in the inferior
left pelvis in the expected position of the distal left ureter.
Otherwise, no calcified urinary tract calculi are seen. There is a
larger oval calcification with central lucency in the right mid
pelvis, possibly representing a small area of calcified fat
necrosis. Probable right iliac bone island.
IMPRESSION: 3 mm possible distal left ureteral calculus.

## 2019-02-12 DIAGNOSIS — F419 Anxiety disorder, unspecified: Secondary | ICD-10-CM | POA: Diagnosis not present

## 2019-02-12 DIAGNOSIS — Z23 Encounter for immunization: Secondary | ICD-10-CM | POA: Diagnosis not present

## 2019-02-12 DIAGNOSIS — R7303 Prediabetes: Secondary | ICD-10-CM | POA: Diagnosis not present

## 2019-02-12 DIAGNOSIS — E282 Polycystic ovarian syndrome: Secondary | ICD-10-CM | POA: Diagnosis not present

## 2019-04-19 DIAGNOSIS — R0989 Other specified symptoms and signs involving the circulatory and respiratory systems: Secondary | ICD-10-CM | POA: Diagnosis not present

## 2019-04-19 DIAGNOSIS — Z20828 Contact with and (suspected) exposure to other viral communicable diseases: Secondary | ICD-10-CM | POA: Diagnosis not present

## 2019-04-20 DIAGNOSIS — Z20828 Contact with and (suspected) exposure to other viral communicable diseases: Secondary | ICD-10-CM | POA: Diagnosis not present

## 2019-04-25 DIAGNOSIS — Z20828 Contact with and (suspected) exposure to other viral communicable diseases: Secondary | ICD-10-CM | POA: Diagnosis not present

## 2019-08-30 DIAGNOSIS — F329 Major depressive disorder, single episode, unspecified: Secondary | ICD-10-CM | POA: Diagnosis not present

## 2019-08-30 DIAGNOSIS — F419 Anxiety disorder, unspecified: Secondary | ICD-10-CM | POA: Diagnosis not present

## 2019-09-22 DIAGNOSIS — Z23 Encounter for immunization: Secondary | ICD-10-CM | POA: Diagnosis not present

## 2019-09-24 DIAGNOSIS — Z23 Encounter for immunization: Secondary | ICD-10-CM | POA: Diagnosis not present

## 2019-09-29 DIAGNOSIS — Z23 Encounter for immunization: Secondary | ICD-10-CM | POA: Diagnosis not present

## 2019-09-30 DIAGNOSIS — Z23 Encounter for immunization: Secondary | ICD-10-CM | POA: Diagnosis not present

## 2019-11-20 DIAGNOSIS — K439 Ventral hernia without obstruction or gangrene: Secondary | ICD-10-CM | POA: Diagnosis not present

## 2019-11-20 DIAGNOSIS — F4001 Agoraphobia with panic disorder: Secondary | ICD-10-CM | POA: Diagnosis not present

## 2019-11-20 DIAGNOSIS — Z6841 Body Mass Index (BMI) 40.0 and over, adult: Secondary | ICD-10-CM | POA: Diagnosis not present

## 2020-01-05 DIAGNOSIS — Z20822 Contact with and (suspected) exposure to covid-19: Secondary | ICD-10-CM | POA: Diagnosis not present

## 2020-03-31 DIAGNOSIS — M25562 Pain in left knee: Secondary | ICD-10-CM | POA: Diagnosis not present

## 2020-03-31 DIAGNOSIS — S8392XA Sprain of unspecified site of left knee, initial encounter: Secondary | ICD-10-CM | POA: Diagnosis not present

## 2020-04-05 DIAGNOSIS — S8392XA Sprain of unspecified site of left knee, initial encounter: Secondary | ICD-10-CM | POA: Diagnosis not present

## 2020-04-09 DIAGNOSIS — S83412D Sprain of medial collateral ligament of left knee, subsequent encounter: Secondary | ICD-10-CM | POA: Diagnosis not present

## 2020-04-09 DIAGNOSIS — S83512D Sprain of anterior cruciate ligament of left knee, subsequent encounter: Secondary | ICD-10-CM | POA: Diagnosis not present

## 2020-04-12 DIAGNOSIS — M25562 Pain in left knee: Secondary | ICD-10-CM | POA: Diagnosis not present

## 2020-04-12 DIAGNOSIS — S83412A Sprain of medial collateral ligament of left knee, initial encounter: Secondary | ICD-10-CM | POA: Diagnosis not present

## 2020-04-12 DIAGNOSIS — S83512A Sprain of anterior cruciate ligament of left knee, initial encounter: Secondary | ICD-10-CM | POA: Diagnosis not present

## 2020-04-26 DIAGNOSIS — S83412A Sprain of medial collateral ligament of left knee, initial encounter: Secondary | ICD-10-CM | POA: Diagnosis not present

## 2020-04-26 DIAGNOSIS — M25562 Pain in left knee: Secondary | ICD-10-CM | POA: Diagnosis not present

## 2020-04-26 DIAGNOSIS — S83512A Sprain of anterior cruciate ligament of left knee, initial encounter: Secondary | ICD-10-CM | POA: Diagnosis not present

## 2020-05-01 DIAGNOSIS — S83512A Sprain of anterior cruciate ligament of left knee, initial encounter: Secondary | ICD-10-CM | POA: Diagnosis not present

## 2020-05-01 DIAGNOSIS — S83412A Sprain of medial collateral ligament of left knee, initial encounter: Secondary | ICD-10-CM | POA: Diagnosis not present

## 2020-05-01 DIAGNOSIS — M25562 Pain in left knee: Secondary | ICD-10-CM | POA: Diagnosis not present

## 2020-05-07 DIAGNOSIS — S83512A Sprain of anterior cruciate ligament of left knee, initial encounter: Secondary | ICD-10-CM | POA: Diagnosis not present

## 2020-05-08 DIAGNOSIS — M25562 Pain in left knee: Secondary | ICD-10-CM | POA: Diagnosis not present

## 2020-05-08 DIAGNOSIS — S83412A Sprain of medial collateral ligament of left knee, initial encounter: Secondary | ICD-10-CM | POA: Diagnosis not present

## 2020-05-08 DIAGNOSIS — S83512A Sprain of anterior cruciate ligament of left knee, initial encounter: Secondary | ICD-10-CM | POA: Diagnosis not present

## 2020-05-16 DIAGNOSIS — Z1159 Encounter for screening for other viral diseases: Secondary | ICD-10-CM | POA: Diagnosis not present

## 2020-05-16 DIAGNOSIS — Z23 Encounter for immunization: Secondary | ICD-10-CM | POA: Diagnosis not present

## 2020-05-16 DIAGNOSIS — Z0000 Encounter for general adult medical examination without abnormal findings: Secondary | ICD-10-CM | POA: Diagnosis not present

## 2020-05-16 DIAGNOSIS — R7303 Prediabetes: Secondary | ICD-10-CM | POA: Diagnosis not present

## 2020-05-17 DIAGNOSIS — M25562 Pain in left knee: Secondary | ICD-10-CM | POA: Diagnosis not present

## 2020-05-17 DIAGNOSIS — S83412A Sprain of medial collateral ligament of left knee, initial encounter: Secondary | ICD-10-CM | POA: Diagnosis not present

## 2020-05-17 DIAGNOSIS — S83512A Sprain of anterior cruciate ligament of left knee, initial encounter: Secondary | ICD-10-CM | POA: Diagnosis not present

## 2020-07-06 ENCOUNTER — Ambulatory Visit: Payer: Self-pay

## 2020-08-20 DIAGNOSIS — R21 Rash and other nonspecific skin eruption: Secondary | ICD-10-CM | POA: Diagnosis not present

## 2020-08-20 DIAGNOSIS — F4001 Agoraphobia with panic disorder: Secondary | ICD-10-CM | POA: Diagnosis not present

## 2020-08-20 DIAGNOSIS — T7840XA Allergy, unspecified, initial encounter: Secondary | ICD-10-CM | POA: Diagnosis not present

## 2020-10-24 DIAGNOSIS — F5104 Psychophysiologic insomnia: Secondary | ICD-10-CM | POA: Diagnosis not present

## 2020-10-24 DIAGNOSIS — F4001 Agoraphobia with panic disorder: Secondary | ICD-10-CM | POA: Diagnosis not present

## 2020-10-24 DIAGNOSIS — F3181 Bipolar II disorder: Secondary | ICD-10-CM | POA: Diagnosis not present

## 2021-02-18 DIAGNOSIS — R635 Abnormal weight gain: Secondary | ICD-10-CM | POA: Diagnosis not present

## 2021-04-30 DIAGNOSIS — R29898 Other symptoms and signs involving the musculoskeletal system: Secondary | ICD-10-CM | POA: Diagnosis not present

## 2021-04-30 DIAGNOSIS — Z7689 Persons encountering health services in other specified circumstances: Secondary | ICD-10-CM | POA: Diagnosis not present

## 2021-04-30 DIAGNOSIS — E282 Polycystic ovarian syndrome: Secondary | ICD-10-CM | POA: Diagnosis not present

## 2021-04-30 DIAGNOSIS — Z87891 Personal history of nicotine dependence: Secondary | ICD-10-CM | POA: Diagnosis not present

## 2021-04-30 DIAGNOSIS — K219 Gastro-esophageal reflux disease without esophagitis: Secondary | ICD-10-CM | POA: Diagnosis not present

## 2021-04-30 DIAGNOSIS — Z6841 Body Mass Index (BMI) 40.0 and over, adult: Secondary | ICD-10-CM | POA: Diagnosis not present

## 2021-04-30 DIAGNOSIS — Z713 Dietary counseling and surveillance: Secondary | ICD-10-CM | POA: Diagnosis not present

## 2021-04-30 DIAGNOSIS — R7303 Prediabetes: Secondary | ICD-10-CM | POA: Diagnosis not present

## 2021-04-30 DIAGNOSIS — M6281 Muscle weakness (generalized): Secondary | ICD-10-CM | POA: Diagnosis not present

## 2021-05-07 DIAGNOSIS — B3731 Acute candidiasis of vulva and vagina: Secondary | ICD-10-CM | POA: Diagnosis not present

## 2021-08-07 DIAGNOSIS — F4001 Agoraphobia with panic disorder: Secondary | ICD-10-CM | POA: Diagnosis not present

## 2021-08-07 DIAGNOSIS — R635 Abnormal weight gain: Secondary | ICD-10-CM | POA: Diagnosis not present

## 2021-08-07 DIAGNOSIS — J301 Allergic rhinitis due to pollen: Secondary | ICD-10-CM | POA: Diagnosis not present

## 2021-08-07 DIAGNOSIS — F5104 Psychophysiologic insomnia: Secondary | ICD-10-CM | POA: Diagnosis not present

## 2022-04-28 DIAGNOSIS — Z23 Encounter for immunization: Secondary | ICD-10-CM | POA: Diagnosis not present

## 2022-04-28 DIAGNOSIS — R635 Abnormal weight gain: Secondary | ICD-10-CM | POA: Diagnosis not present

## 2022-04-28 DIAGNOSIS — F4001 Agoraphobia with panic disorder: Secondary | ICD-10-CM | POA: Diagnosis not present

## 2022-04-28 DIAGNOSIS — Z1331 Encounter for screening for depression: Secondary | ICD-10-CM | POA: Diagnosis not present

## 2022-04-28 DIAGNOSIS — J301 Allergic rhinitis due to pollen: Secondary | ICD-10-CM | POA: Diagnosis not present

## 2022-04-28 DIAGNOSIS — F5104 Psychophysiologic insomnia: Secondary | ICD-10-CM | POA: Diagnosis not present

## 2022-04-28 DIAGNOSIS — L219 Seborrheic dermatitis, unspecified: Secondary | ICD-10-CM | POA: Diagnosis not present

## 2022-06-02 DIAGNOSIS — Z1331 Encounter for screening for depression: Secondary | ICD-10-CM | POA: Diagnosis not present

## 2022-06-02 DIAGNOSIS — R635 Abnormal weight gain: Secondary | ICD-10-CM | POA: Diagnosis not present

## 2022-06-02 DIAGNOSIS — R7303 Prediabetes: Secondary | ICD-10-CM | POA: Diagnosis not present

## 2022-06-02 DIAGNOSIS — Z0000 Encounter for general adult medical examination without abnormal findings: Secondary | ICD-10-CM | POA: Diagnosis not present

## 2022-06-02 DIAGNOSIS — Z124 Encounter for screening for malignant neoplasm of cervix: Secondary | ICD-10-CM | POA: Diagnosis not present

## 2022-06-02 DIAGNOSIS — F5104 Psychophysiologic insomnia: Secondary | ICD-10-CM | POA: Diagnosis not present

## 2022-06-02 DIAGNOSIS — Z13220 Encounter for screening for lipoid disorders: Secondary | ICD-10-CM | POA: Diagnosis not present

## 2022-06-02 DIAGNOSIS — F4001 Agoraphobia with panic disorder: Secondary | ICD-10-CM | POA: Diagnosis not present

## 2023-05-10 ENCOUNTER — Inpatient Hospital Stay: Payer: Managed Care, Other (non HMO) | Attending: Internal Medicine | Admitting: Internal Medicine

## 2023-05-10 ENCOUNTER — Inpatient Hospital Stay: Payer: Managed Care, Other (non HMO)

## 2023-05-10 ENCOUNTER — Encounter: Payer: Self-pay | Admitting: Internal Medicine

## 2023-05-10 VITALS — BP 138/88 | HR 82 | Temp 99.0°F | Resp 18 | Wt 278.0 lb

## 2023-05-10 DIAGNOSIS — Z808 Family history of malignant neoplasm of other organs or systems: Secondary | ICD-10-CM | POA: Insufficient documentation

## 2023-05-10 DIAGNOSIS — Z8041 Family history of malignant neoplasm of ovary: Secondary | ICD-10-CM | POA: Insufficient documentation

## 2023-05-10 DIAGNOSIS — Z87891 Personal history of nicotine dependence: Secondary | ICD-10-CM | POA: Diagnosis not present

## 2023-05-10 DIAGNOSIS — R791 Abnormal coagulation profile: Secondary | ICD-10-CM | POA: Diagnosis not present

## 2023-05-10 NOTE — Progress Notes (Signed)
 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  Patient reports heavy menstrual cycles ongoing since 2014 when she had tubal ligation.  However she had the worst bleeding ever starting from November 11 to December 30.  At that time, she had labs done for bleeding disorder.  Factor VIII activity 234, von Willebrand antigen 266 and von Willebrand factor activity 242.  Factor V activity 130.  Ferritin 123.  Hemoglobin 14  Patient reports family history of factor V Leiden in 3 and and lupus.  But denies any family history of blood clots.  Reports history of stroke in her mother. No personal history of blood clot. She has completed family-planning.  Reports she will be seeing OB/GYN on January 15 for consideration of hysterectomy.  Currently on Yasmin birth control for bleeding regulation.   REVIEW OF SYSTEMS:   ROS  As per HPI. Otherwise, a complete review of systems is negative.  PAST MEDICAL HISTORY: Past Medical History:  Diagnosis Date   Chlamydia 2012, 2014   Family history of ovarian cancer    genetic testing letter sent 8/18   Obesity     PAST SURGICAL HISTORY: Past Surgical History:  Procedure Laterality Date   CESAREAN SECTION     TUBAL LIGATION      FAMILY HISTORY: Family History  Problem Relation Age of Onset   Hypertension Mother    Ovarian cancer Mother 20   Uterine cancer Mother 24   Hypertension Father    Diabetes Brother    Hypertension Brother    Lupus Maternal Aunt    Factor V Leiden deficiency Maternal Aunt    Diabetes Maternal Grandmother     Hypertension Maternal Grandmother    Hypertension Maternal Grandfather    Hypertension Paternal Grandmother    Hypertension Paternal Grandfather     HEALTH MAINTENANCE: Social History   Tobacco Use   Smoking status: Former   Smokeless tobacco: Never  Advertising Account Planner   Vaping status: Never Used  Substance Use Topics   Alcohol use: No   Drug use: No     No Known Allergies  Current Outpatient Medications  Medication Sig Dispense Refill   drospirenone-ethinyl estradiol  (YASMIN) 3-0.03 MG tablet Take 1 tablet by mouth daily.     loratadine  (CLARITIN ) 10 MG tablet Take 10 mg by mouth daily.     montelukast (SINGULAIR) 10 MG tablet Take by mouth.     Multiple Vitamin (MULTIVITAMIN) tablet Take 1 tablet by mouth daily.     vortioxetine HBr (TRINTELLIX) 5 MG TABS tablet Take by mouth.     No current facility-administered medications for this visit.    OBJECTIVE: Vitals:   05/10/23 1114  BP: 138/88  Pulse: 82  Resp: 18  Temp: 99 F (37.2 C)  SpO2: 100%     Body mass index is 47.72 kg/m.      General: Well-developed, well-nourished, no acute distress. Eyes: Pink conjunctiva, anicteric sclera. HEENT: Normocephalic, moist mucous membranes, clear oropharnyx. Lungs: Clear to auscultation bilaterally. Heart: Regular rate and rhythm. No rubs, murmurs, or gallops. Abdomen: Soft, nontender, nondistended. No organomegaly noted, normoactive bowel sounds. Musculoskeletal:  No edema, cyanosis, or clubbing. Neuro: Alert, answering all questions appropriately. Cranial nerves grossly intact. Skin: No rashes or petechiae noted. Psych: Normal affect. Lymphatics: No cervical, calvicular, axillary or inguinal LAD.   LAB RESULTS:  No results found for: NA, K, CL, CO2, GLUCOSE, BUN, CREATININE, CALCIUM, PROT, ALBUMIN, AST, ALT, ALKPHOS, BILITOT, GFRNONAA, GFRAA  Lab Results  Component Value Date   WBC 10.6 04/04/2013   NEUTROABS 8.2 (H) 04/04/2013   HGB  12.2 04/04/2013   HCT 29.1 (L) 04/07/2013   MCV 86 04/04/2013   PLT 177 04/04/2013    No results found for: TIBC, FERRITIN, IRONPCTSAT   STUDIES: No results found.  ASSESSMENT AND PLAN:   Donna Fletcher is a 32 y.o. 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  # Elevated factor VIII level # Elevated von Willebrand antigen and antibody level - Labs were tested for heavy menstrual cycles.  - Factor VIII activity 234, von Willebrand antigen 266 and von Willebrand factor activity 242.  Factor V activity 130.  Ferritin 123.  Hemoglobin 14.  Patient reports family history of factor V Leiden in her maternal aunts.   Denies any family history or personal history of blood clot.  I discussed with the patient that elevated factor VIII activity has shown to increase the thrombotic risk however the guidelines do not recommend anticoagulation in the absence of blood clot.  Similarly, factor V Leiden testing is not strongly indicated since she does not have any history of blood clot, she has completed family-planning.  I advised taking precautions to decrease the risk of blood clot such as if she is going on a long car ride or a flight to to take breaks and to ambulate in between to help with the blood flow.  Consider compression stockings and hydration.  We also discussed about increased thrombotic risk with estrogen containing birth control.  Switching to progesterone only would be a reasonable option.  Patient will follow-up with me as needed.  All her questions were answered.  RTC as needed  Patient expressed understanding and was in agreement with this plan. She also understands that She can call clinic at any time with any questions, concerns, or complaints.   I spent a total of 45 minutes reviewing chart data, face-to-face evaluation with the patient, counseling and coordination of care as detailed above.  Vernestine Brodhead, MD   05/10/2023 11:25 AM

## 2023-06-06 ENCOUNTER — Other Ambulatory Visit: Payer: Self-pay | Admitting: Obstetrics and Gynecology

## 2023-06-29 ENCOUNTER — Encounter
Admission: RE | Admit: 2023-06-29 | Discharge: 2023-06-29 | Disposition: A | Discharge status: Home / Self Care | Payer: Managed Care, Other (non HMO) | Source: Ambulatory Visit | Attending: Obstetrics and Gynecology | Admitting: Obstetrics and Gynecology

## 2023-06-29 ENCOUNTER — Other Ambulatory Visit: Payer: Self-pay

## 2023-06-29 VITALS — Ht 64.0 in | Wt 278.0 lb

## 2023-06-29 DIAGNOSIS — N939 Abnormal uterine and vaginal bleeding, unspecified: Secondary | ICD-10-CM

## 2023-06-29 DIAGNOSIS — Z01812 Encounter for preprocedural laboratory examination: Secondary | ICD-10-CM

## 2023-06-29 DIAGNOSIS — R791 Abnormal coagulation profile: Secondary | ICD-10-CM

## 2023-06-29 HISTORY — DX: Abnormal uterine and vaginal bleeding, unspecified: N93.9

## 2023-06-29 HISTORY — DX: Polycystic ovarian syndrome: E28.2

## 2023-06-29 HISTORY — DX: Nausea with vomiting, unspecified: R11.2

## 2023-06-29 HISTORY — DX: Abnormal coagulation profile: R79.1

## 2023-06-29 HISTORY — DX: Morbid (severe) obesity due to excess calories: E66.01

## 2023-06-29 HISTORY — DX: Ventral hernia without obstruction or gangrene: K43.9

## 2023-06-29 NOTE — Patient Instructions (Addendum)
 Your procedure is scheduled on: Thursday, February 27 Report to the Registration Desk on the 1st floor of the [facility] To find out your arrival time, please call [phone] between 1PM - 3PM on: Wednesday, February 26 If your arrival time is 6:00 am, do not arrive before that time as the [facility] entrance doors do not open until 6:00 am.  REMEMBER: Instructions that are not followed completely may result in serious medical risk, up to and including death; or upon the discretion of your surgeon and anesthesiologist your surgery may need to be rescheduled.  Do not eat food after midnight the night before surgery.  No gum chewing or hard candies.  You may however, drink CLEAR liquids up to 2 hours before you are scheduled to arrive for your surgery. Do not drink anything within 2 hours of your scheduled arrival time.  Clear liquids include: - water  - apple juice without pulp - gatorade (not RED colors) - black coffee or tea (Do NOT add milk or creamers to the coffee or tea) Do NOT drink anything that is not on this list.  In addition, your doctor has ordered for you to drink the provided:  Ensure Pre-Surgery Clear Carbohydrate Drink  Drinking this carbohydrate drink up to two hours before surgery helps to reduce insulin resistance and improve patient outcomes. Please complete drinking 2 hours before scheduled arrival time.  One week prior to surgery: starting February 20 Stop Anti-inflammatories (NSAIDS) such as Advil, Aleve, Ibuprofen, Motrin, Naproxen, Naprosyn and Aspirin based products such as Excedrin, Goody's Powder, BC Powder. Stop ANY OVER THE COUNTER supplements until after surgery. Stop multiple vitamins.  You may however, continue to take Tylenol if needed for pain up until the day of surgery.  Continue taking all of your other prescription medications up until the day of surgery.  ON THE DAY OF SURGERY ONLY TAKE THESE MEDICATIONS WITH SIPS OF  WATER:  vortioxetine HBr (TRINTELLIX)   No Alcohol for 24 hours before or after surgery.  No Smoking including e-cigarettes for 24 hours before surgery.  No chewable tobacco products for at least 6 hours before surgery.  No nicotine patches on the day of surgery.  Do not use any "recreational" drugs for at least a week (preferably 2 weeks) before your surgery.  Please be advised that the combination of cocaine and anesthesia may have negative outcomes, up to and including death. If you test positive for cocaine, your surgery will be cancelled.  On the morning of surgery brush your teeth with toothpaste and water, you may rinse your mouth with mouthwash if you wish. Do not swallow any toothpaste or mouthwash.  Use CHG Soap as directed on instruction sheet.  Do not wear jewelry, make-up, hairpins, clips or nail polish.  For welded (permanent) jewelry: bracelets, anklets, waist bands, etc.  Please have this removed prior to surgery.  If it is not removed, there is a chance that hospital personnel will need to cut it off on the day of surgery.  Do not wear lotions, powders, or perfumes.   Do not shave body hair from the neck down 48 hours before surgery.  Contact lenses, hearing aids and dentures may not be worn into surgery.  Do not bring valuables to the hospital. [facility] is not responsible for any missing/lost belongings or valuables.   Notify your doctor if there is any change in your medical condition (cold, fever, infection).  Wear comfortable clothing (specific to your surgery type) to  the hospital.  After surgery, you can help prevent lung complications by doing breathing exercises.  Take deep breaths and cough every 1-2 hours. Your doctor may order a device called an Incentive Spirometer to help you take deep breaths. When coughing or sneezing, hold a pillow firmly against your incision with both hands. This is called "splinting." Doing this helps protect your incision.  It also decreases belly discomfort.  If you are being discharged the day of surgery, you will not be allowed to drive home. You will need a responsible individual to drive you home and stay with you for 24 hours after surgery.   If you are taking public transportation, you will need to have a responsible individual with you.  Please call the Pre-admissions Testing Dept. at [phone] if you have any questions about these instructions.  Surgery Visitation Policy:  Patients having surgery or a procedure may have two visitors.  Children under the age of 36 must have an adult with them who is not the patient.  Temporary Visitor Restrictions Due to increasing cases of flu, RSV and COVID-19: Children ages 36 and under will not be able to visit patients in [facility] hospitals under most circumstances.      Preparing for Surgery with CHLORHEXIDINE GLUCONATE (CHG) Soap  Chlorhexidine Gluconate (CHG) Soap  o An antiseptic cleaner that kills germs and bonds with the skin to continue killing germs even after washing  o Used for showering the night before surgery and morning of surgery  Before surgery, you can play an important role by reducing the number of germs on your skin.  CHG (Chlorhexidine gluconate) soap is an antiseptic cleanser which kills germs and bonds with the skin to continue killing germs even after washing.  Please do not use if you have an allergy to CHG or antibacterial soaps. If your skin becomes reddened/irritated stop using the CHG.  1. Shower the NIGHT BEFORE SURGERY and the MORNING OF SURGERY with CHG soap.  2. If you choose to wash your hair, wash your hair first as usual with your normal shampoo.  3. After shampooing, rinse your hair and body thoroughly to remove the shampoo.  4. Use CHG as you would any other liquid soap. You can apply CHG directly to the skin and wash gently with a scrungie or a clean washcloth.  5. Apply the CHG soap to your body only  from the neck down. Do not use on open wounds or open sores. Avoid contact with your eyes, ears, mouth, and genitals (private parts). Wash face and genitals (private parts) with your normal soap.  6. Wash thoroughly, paying special attention to the area where your surgery will be performed.  7. Thoroughly rinse your body with warm water.  8. Do not shower/wash with your normal soap after using and rinsing off the CHG soap.  9. Pat yourself dry with a clean towel.  10. Wear clean pajamas to bed the night before surgery.  12. Place clean sheets on your bed the night of your first shower and do not sleep with pets.  13. Shower again with the CHG soap on the day of surgery prior to arriving at the hospital.  14. Do not apply any deodorants/lotions/powders.  15. Please wear clean clothes to the hospital.

## 2023-07-04 ENCOUNTER — Encounter
Admission: RE | Admit: 2023-07-04 | Discharge: 2023-07-04 | Disposition: A | Discharge status: Home / Self Care | Payer: Managed Care, Other (non HMO) | Source: Ambulatory Visit | Attending: Obstetrics and Gynecology | Admitting: Obstetrics and Gynecology

## 2023-07-04 DIAGNOSIS — Z01812 Encounter for preprocedural laboratory examination: Secondary | ICD-10-CM | POA: Insufficient documentation

## 2023-07-04 DIAGNOSIS — R791 Abnormal coagulation profile: Secondary | ICD-10-CM | POA: Diagnosis not present

## 2023-07-04 DIAGNOSIS — N939 Abnormal uterine and vaginal bleeding, unspecified: Secondary | ICD-10-CM | POA: Diagnosis not present

## 2023-07-04 LAB — BASIC METABOLIC PANEL
Anion gap: 8 (ref 5–15)
BUN: 10 mg/dL (ref 6–20)
CO2: 26 mmol/L (ref 22–32)
Calcium: 8.8 mg/dL — ABNORMAL LOW (ref 8.9–10.3)
Chloride: 106 mmol/L (ref 98–111)
Creatinine, Ser: 0.78 mg/dL (ref 0.44–1.00)
GFR, Estimated: >60 mL/min (ref >60)
Glucose, Bld: 111 mg/dL — ABNORMAL HIGH (ref 70–99)
Potassium: 4.3 mmol/L (ref 3.5–5.1)
Sodium: 140 mmol/L (ref 135–145)

## 2023-07-04 LAB — CBC
HCT: 39.5 % (ref 36.0–46.0)
Hemoglobin: 13.1 g/dL (ref 12.0–15.0)
MCH: 27.6 pg (ref 26.0–34.0)
MCHC: 33.2 g/dL (ref 30.0–36.0)
MCV: 83.3 fL (ref 80.0–100.0)
Platelets: 321 10*3/uL (ref 150–400)
RBC: 4.74 MIL/uL (ref 3.87–5.11)
RDW: 13.3 % (ref 11.5–15.5)
WBC: 8.3 10*3/uL (ref 4.0–10.5)
nRBC: 0 % (ref 0.0–0.2)

## 2023-07-04 LAB — TYPE AND SCREEN
ABO/RH(D): A POS
Antibody Screen: NEGATIVE

## 2023-07-06 ENCOUNTER — Encounter: Payer: Self-pay | Admitting: Obstetrics and Gynecology

## 2023-07-06 MED ORDER — CHLORHEXIDINE GLUCONATE 0.12 % MT SOLN
15.0000 mL | Freq: Once | OROMUCOSAL | Status: AC
  Administered 2023-07-07: 15 mL via OROMUCOSAL

## 2023-07-06 MED ORDER — ORAL CARE MOUTH RINSE: 15.0000 mL | Freq: Once | OROMUCOSAL | Status: AC

## 2023-07-06 MED ORDER — LACTATED RINGERS IV SOLN: INTRAVENOUS | Status: DC

## 2023-07-07 ENCOUNTER — Other Ambulatory Visit: Payer: Self-pay

## 2023-07-07 ENCOUNTER — Ambulatory Visit: Payer: Managed Care, Other (non HMO) | Admitting: Anesthesiology

## 2023-07-07 ENCOUNTER — Ambulatory Visit: Payer: Self-pay | Admitting: Urgent Care

## 2023-07-07 ENCOUNTER — Encounter
Admission: RE | Disposition: A | Discharge status: Home / Self Care | Payer: Self-pay | Source: Ambulatory Visit | Attending: Obstetrics and Gynecology

## 2023-07-07 ENCOUNTER — Encounter: Payer: Self-pay | Admitting: Obstetrics and Gynecology

## 2023-07-07 ENCOUNTER — Ambulatory Visit
Admission: RE | Admit: 2023-07-07 | Discharge: 2023-07-07 | Disposition: A | Discharge status: Home / Self Care | Payer: Managed Care, Other (non HMO) | Source: Ambulatory Visit | Attending: Obstetrics and Gynecology | Admitting: Obstetrics and Gynecology

## 2023-07-07 DIAGNOSIS — Z9851 Tubal ligation status: Secondary | ICD-10-CM | POA: Insufficient documentation

## 2023-07-07 DIAGNOSIS — F32A Depression, unspecified: Secondary | ICD-10-CM | POA: Diagnosis not present

## 2023-07-07 DIAGNOSIS — N920 Excessive and frequent menstruation with regular cycle: Secondary | ICD-10-CM | POA: Insufficient documentation

## 2023-07-07 DIAGNOSIS — N939 Abnormal uterine and vaginal bleeding, unspecified: Secondary | ICD-10-CM | POA: Diagnosis present

## 2023-07-07 DIAGNOSIS — Z6841 Body Mass Index (BMI) 40.0 and over, adult: Secondary | ICD-10-CM | POA: Insufficient documentation

## 2023-07-07 DIAGNOSIS — Z87891 Personal history of nicotine dependence: Secondary | ICD-10-CM | POA: Diagnosis not present

## 2023-07-07 DIAGNOSIS — N838 Other noninflammatory disorders of ovary, fallopian tube and broad ligament: Secondary | ICD-10-CM | POA: Diagnosis not present

## 2023-07-07 DIAGNOSIS — E66813 Obesity, class 3: Secondary | ICD-10-CM | POA: Diagnosis not present

## 2023-07-07 DIAGNOSIS — D251 Intramural leiomyoma of uterus: Secondary | ICD-10-CM | POA: Insufficient documentation

## 2023-07-07 DIAGNOSIS — F419 Anxiety disorder, unspecified: Secondary | ICD-10-CM | POA: Diagnosis not present

## 2023-07-07 DIAGNOSIS — Z01812 Encounter for preprocedural laboratory examination: Secondary | ICD-10-CM

## 2023-07-07 HISTORY — PX: CYSTOSCOPY: SHX5120

## 2023-07-07 HISTORY — PX: ROBOTIC ASSISTED LAPAROSCOPIC HYSTERECTOMY AND SALPINGECTOMY: SHX6379

## 2023-07-07 HISTORY — PX: EXCISION OF ADNEXAL MASS: SHX5820

## 2023-07-07 LAB — ABO/RH: ABO/RH(D): A POS

## 2023-07-07 SURGERY — XI ROBOTIC ASSISTED LAPAROSCOPIC HYSTERECTOMY AND SALPINGECTOMY
Anesthesia: General | Site: Bladder | Laterality: Right

## 2023-07-07 MED ORDER — BUPIVACAINE HCL (PF) 0.5 % IJ SOLN
INTRAMUSCULAR | Status: DC | PRN
  Administered 2023-07-07: 14 mL

## 2023-07-07 MED ORDER — ACETAMINOPHEN 10 MG/ML IV SOLN: 1000.0000 mg | Freq: Once | INTRAVENOUS | Status: DC | PRN

## 2023-07-07 MED ORDER — POVIDONE-IODINE 10 % EX SWAB: 2.0000 | Freq: Once | CUTANEOUS | Status: DC

## 2023-07-07 MED ORDER — ACETAMINOPHEN EXTRA STRENGTH 500 MG PO TABS: 1000.0000 mg | ORAL_TABLET | Freq: Four times a day (QID) | ORAL | 0 refills | Status: AC

## 2023-07-07 MED ORDER — PROPOFOL 1000 MG/100ML IV EMUL
INTRAVENOUS | Status: AC
  Filled 2023-07-07: qty 100

## 2023-07-07 MED ORDER — LIDOCAINE HCL (CARDIAC) PF 100 MG/5ML IV SOSY
PREFILLED_SYRINGE | INTRAVENOUS | Status: DC | PRN
  Administered 2023-07-07: 100 mg via INTRAVENOUS

## 2023-07-07 MED ORDER — LIDOCAINE HCL (PF) 2 % IJ SOLN
INTRAMUSCULAR | Status: AC
  Filled 2023-07-07: qty 5

## 2023-07-07 MED ORDER — BUPIVACAINE HCL (PF) 0.5 % IJ SOLN
INTRAMUSCULAR | Status: AC
  Filled 2023-07-07: qty 30

## 2023-07-07 MED ORDER — PROPOFOL 10 MG/ML IV BOLUS
INTRAVENOUS | Status: AC
  Filled 2023-07-07: qty 20

## 2023-07-07 MED ORDER — CEFAZOLIN SODIUM-DEXTROSE 3-4 GM/150ML-% IV SOLN
3.0000 g | INTRAVENOUS | Status: DC
  Filled 2023-07-07: qty 150

## 2023-07-07 MED ORDER — OXYCODONE HCL 5 MG PO TABS
5.0000 mg | ORAL_TABLET | Freq: Once | ORAL | Status: AC | PRN
  Administered 2023-07-07: 5 mg via ORAL

## 2023-07-07 MED ORDER — FENTANYL CITRATE (PF) 100 MCG/2ML IJ SOLN
25.0000 ug | INTRAMUSCULAR | Status: DC | PRN
  Administered 2023-07-07 (×4): 25 ug via INTRAVENOUS

## 2023-07-07 MED ORDER — FENTANYL CITRATE (PF) 100 MCG/2ML IJ SOLN
INTRAMUSCULAR | Status: AC
  Filled 2023-07-07: qty 2

## 2023-07-07 MED ORDER — SCOPOLAMINE 1 MG/3DAYS TD PT72
1.0000 | MEDICATED_PATCH | TRANSDERMAL | Status: DC
  Administered 2023-07-07: 1.5 mg via TRANSDERMAL

## 2023-07-07 MED ORDER — GABAPENTIN 300 MG PO CAPS: 300.0000 mg | ORAL_CAPSULE | Freq: Every day | ORAL | 0 refills | Status: DC

## 2023-07-07 MED ORDER — ONDANSETRON 4 MG PO TBDP: 4.0000 mg | ORAL_TABLET | Freq: Three times a day (TID) | ORAL | 0 refills | Status: AC | PRN

## 2023-07-07 MED ORDER — KETOROLAC TROMETHAMINE 30 MG/ML IJ SOLN
INTRAMUSCULAR | Status: AC
  Filled 2023-07-07: qty 1

## 2023-07-07 MED ORDER — FENTANYL CITRATE (PF) 100 MCG/2ML IJ SOLN
INTRAMUSCULAR | Status: DC | PRN
  Administered 2023-07-07 (×2): 50 ug via INTRAVENOUS

## 2023-07-07 MED ORDER — ACETAMINOPHEN 500 MG PO TABS
ORAL_TABLET | ORAL | Status: AC
  Filled 2023-07-07: qty 2

## 2023-07-07 MED ORDER — GABAPENTIN 300 MG PO CAPS
ORAL_CAPSULE | ORAL | Status: AC
  Filled 2023-07-07: qty 1

## 2023-07-07 MED ORDER — ONDANSETRON HCL 4 MG/2ML IJ SOLN
INTRAMUSCULAR | Status: AC
Start: 2023-07-07 — End: ?
  Filled 2023-07-07: qty 2

## 2023-07-07 MED ORDER — ONDANSETRON HCL 4 MG/2ML IJ SOLN: 4.0000 mg | Freq: Once | INTRAMUSCULAR | Status: DC | PRN

## 2023-07-07 MED ORDER — DEXAMETHASONE SODIUM PHOSPHATE 10 MG/ML IJ SOLN
INTRAMUSCULAR | Status: DC | PRN
  Administered 2023-07-07: 10 mg via INTRAVENOUS

## 2023-07-07 MED ORDER — SUGAMMADEX SODIUM 200 MG/2ML IV SOLN
INTRAVENOUS | Status: AC
  Filled 2023-07-07: qty 2

## 2023-07-07 MED ORDER — SODIUM CHLORIDE 0.9 % IR SOLN
Status: DC | PRN
  Administered 2023-07-07: 1 via INTRAVESICAL

## 2023-07-07 MED ORDER — GABAPENTIN 300 MG PO CAPS
300.0000 mg | ORAL_CAPSULE | ORAL | Status: AC
  Administered 2023-07-07: 300 mg via ORAL

## 2023-07-07 MED ORDER — MIDAZOLAM HCL 2 MG/2ML IJ SOLN
INTRAMUSCULAR | Status: AC
  Filled 2023-07-07: qty 2

## 2023-07-07 MED ORDER — PROPOFOL 500 MG/50ML IV EMUL
INTRAVENOUS | Status: DC | PRN
  Administered 2023-07-07: 55 ug/kg/min via INTRAVENOUS

## 2023-07-07 MED ORDER — SCOPOLAMINE 1 MG/3DAYS TD PT72
MEDICATED_PATCH | TRANSDERMAL | Status: AC
  Filled 2023-07-07: qty 1

## 2023-07-07 MED ORDER — OXYCODONE HCL 5 MG PO TABS
ORAL_TABLET | ORAL | Status: AC
  Filled 2023-07-07: qty 1

## 2023-07-07 MED ORDER — EPHEDRINE SULFATE-NACL 50-0.9 MG/10ML-% IV SOSY
PREFILLED_SYRINGE | INTRAVENOUS | Status: DC | PRN
  Administered 2023-07-07: 10 mg via INTRAVENOUS
  Administered 2023-07-07 (×2): 5 mg via INTRAVENOUS

## 2023-07-07 MED ORDER — LACTATED RINGERS IV SOLN: INTRAVENOUS | Status: DC

## 2023-07-07 MED ORDER — PROPOFOL 10 MG/ML IV BOLUS
INTRAVENOUS | Status: DC | PRN
  Administered 2023-07-07: 200 mg via INTRAVENOUS

## 2023-07-07 MED ORDER — DOCUSATE SODIUM 100 MG PO CAPS: 100.0000 mg | ORAL_CAPSULE | Freq: Two times a day (BID) | ORAL | 0 refills | Status: DC

## 2023-07-07 MED ORDER — MIDAZOLAM HCL 2 MG/2ML IJ SOLN
INTRAMUSCULAR | Status: DC | PRN
  Administered 2023-07-07: 2 mg via INTRAVENOUS

## 2023-07-07 MED ORDER — CEFAZOLIN SODIUM-DEXTROSE 3-4 GM/150ML-% IV SOLN
3.0000 g | INTRAVENOUS | Status: AC
  Administered 2023-07-07: 3 g via INTRAVENOUS
  Filled 2023-07-07: qty 150

## 2023-07-07 MED ORDER — OXYCODONE HCL 5 MG/5ML PO SOLN: 5.0000 mg | Freq: Once | ORAL | Status: AC | PRN

## 2023-07-07 MED ORDER — KETOROLAC TROMETHAMINE 30 MG/ML IJ SOLN
INTRAMUSCULAR | Status: DC | PRN
  Administered 2023-07-07: 30 mg via INTRAVENOUS

## 2023-07-07 MED ORDER — OXYCODONE HCL 5 MG PO TABS: 5.0000 mg | ORAL_TABLET | ORAL | 0 refills | Status: DC | PRN

## 2023-07-07 MED ORDER — HYDROMORPHONE HCL 1 MG/ML IJ SOLN
INTRAMUSCULAR | Status: DC | PRN
  Administered 2023-07-07: .5 mg via INTRAVENOUS

## 2023-07-07 MED ORDER — HYDROMORPHONE HCL 1 MG/ML IJ SOLN
INTRAMUSCULAR | Status: AC
  Filled 2023-07-07: qty 1

## 2023-07-07 MED ORDER — ROCURONIUM BROMIDE 100 MG/10ML IV SOLN
INTRAVENOUS | Status: DC | PRN
  Administered 2023-07-07: 20 mg via INTRAVENOUS
  Administered 2023-07-07: 5 mg via INTRAVENOUS
  Administered 2023-07-07: 45 mg via INTRAVENOUS

## 2023-07-07 MED ORDER — ROCURONIUM BROMIDE 10 MG/ML (PF) SYRINGE
PREFILLED_SYRINGE | INTRAVENOUS | Status: AC
  Filled 2023-07-07: qty 10

## 2023-07-07 MED ORDER — ONDANSETRON HCL 4 MG/2ML IJ SOLN
INTRAMUSCULAR | Status: DC | PRN
  Administered 2023-07-07: 4 mg via INTRAVENOUS

## 2023-07-07 MED ORDER — LACTATED RINGERS IV SOLN
INTRAVENOUS | Status: AC | PRN
  Administered 2023-07-07: 1 via INTRAVENOUS

## 2023-07-07 MED ORDER — CELECOXIB 200 MG PO CAPS: 200.0000 mg | ORAL_CAPSULE | Freq: Two times a day (BID) | ORAL | 0 refills | Status: AC

## 2023-07-07 MED ORDER — ACETAMINOPHEN 500 MG PO TABS
1000.0000 mg | ORAL_TABLET | ORAL | Status: AC
  Administered 2023-07-07: 1000 mg via ORAL

## 2023-07-07 MED ORDER — SUGAMMADEX SODIUM 200 MG/2ML IV SOLN
INTRAVENOUS | Status: DC | PRN
  Administered 2023-07-07: 252.2 mg via INTRAVENOUS

## 2023-07-07 MED ORDER — CHLORHEXIDINE GLUCONATE 0.12 % MT SOLN
OROMUCOSAL | Status: AC
  Filled 2023-07-07: qty 15

## 2023-07-07 MED ORDER — DEXAMETHASONE SODIUM PHOSPHATE 10 MG/ML IJ SOLN
INTRAMUSCULAR | Status: AC
  Filled 2023-07-07: qty 1

## 2023-07-07 SURGICAL SUPPLY — 53 items
BAG URINE DRAIN 2000ML AR STRL (UROLOGICAL SUPPLIES) ×3 IMPLANT
BLADE SURG SZ11 CARB STEEL (BLADE) ×3 IMPLANT
CANNULA CAP OBTURATR AIRSEAL 8 (CAP) ×3 IMPLANT
CATH URTH 16FR FL 2W BLN LF (CATHETERS) ×3 IMPLANT
COVER TIP SHEARS 8 DVNC (MISCELLANEOUS) ×3 IMPLANT
DERMABOND ADVANCED .7 DNX12 (GAUZE/BANDAGES/DRESSINGS) ×3 IMPLANT
DRAPE ARM DVNC X/XI (DISPOSABLE) ×12 IMPLANT
DRAPE COLUMN DVNC XI (DISPOSABLE) ×3 IMPLANT
DRIVER NDL MEGA 8 DVNC XI (INSTRUMENTS) ×3 IMPLANT
DRIVER NDLE MEGA DVNC XI (INSTRUMENTS) ×3 IMPLANT
ELECT REM PT RETURN 9FT ADLT (ELECTROSURGICAL) ×3 IMPLANT
ELECTRODE REM PT RTRN 9FT ADLT (ELECTROSURGICAL) ×3 IMPLANT
FORCEPS BPLR FENES DVNC XI (FORCEP) ×3 IMPLANT
FORCEPS BPLR R/ABLATION 8 DVNC (INSTRUMENTS) IMPLANT
GAUZE 4X4 16PLY ~~LOC~~+RFID DBL (SPONGE) ×3 IMPLANT
GLOVE BIO SURGEON STRL SZ7 (GLOVE) ×12 IMPLANT
GLOVE BIOGEL PI IND STRL 7.5 (GLOVE) ×12 IMPLANT
GLOVE INDICATOR 7.5 STRL GRN (GLOVE) ×3 IMPLANT
GOWN STRL REUS W/ TWL LRG LVL3 (GOWN DISPOSABLE) ×18 IMPLANT
GYRUS RUMI II 3.5CM BLUE (DISPOSABLE) ×3 IMPLANT
IRRIGATION STRYKERFLOW (MISCELLANEOUS) IMPLANT
IRRIGATOR STRYKERFLOW (MISCELLANEOUS) ×3 IMPLANT
IV LACTATED RINGERS 1000ML (IV SOLUTION) ×3 IMPLANT
IV NS 1000ML BAXH (IV SOLUTION) IMPLANT
KIT PINK PAD W/HEAD ARE REST (MISCELLANEOUS) ×3 IMPLANT
KIT PINK PAD W/HEAD ARM REST (MISCELLANEOUS) ×3 IMPLANT
LABEL OR SOLS (LABEL) ×3 IMPLANT
MANIFOLD NEPTUNE II (INSTRUMENTS) IMPLANT
NS IRRIG 500ML POUR BTL (IV SOLUTION) ×3 IMPLANT
OBTURATOR OPTICAL STND 8 DVNC (TROCAR) ×3 IMPLANT
OBTURATOR OPTICALSTD 8 DVNC (TROCAR) ×3 IMPLANT
OCCLUDER COLPOPNEUMO (BALLOONS) ×3 IMPLANT
PACK GYN LAPAROSCOPIC (MISCELLANEOUS) ×3 IMPLANT
PAD OB MATERNITY 11 LF (PERSONAL CARE ITEMS) ×3 IMPLANT
PAD PREP OB/GYN DISP 24X41 (PERSONAL CARE ITEMS) ×3 IMPLANT
RUMI II GYRUS 3.5CM BLUE (DISPOSABLE) IMPLANT
SCISSORS MNPLR CVD DVNC XI (INSTRUMENTS) ×3 IMPLANT
SCRUB CHG 4% DYNA-HEX 4OZ (MISCELLANEOUS) ×3 IMPLANT
SEAL UNIV 5-12 XI (MISCELLANEOUS) ×9 IMPLANT
SEALER VESSEL EXT DVNC XI (MISCELLANEOUS) ×3 IMPLANT
SET CYSTO W/LG BORE CLAMP LF (SET/KITS/TRAYS/PACK) ×3 IMPLANT
SET TUBE FILTERED XL AIRSEAL (SET/KITS/TRAYS/PACK) ×3 IMPLANT
SOL ELECTROSURG ANTI STICK (MISCELLANEOUS) ×3 IMPLANT
SOL PREP PVP 2OZ (MISCELLANEOUS) ×3 IMPLANT
SOLUTION ELECTROSURG ANTI STCK (MISCELLANEOUS) ×3 IMPLANT
SOLUTION PREP PVP 2OZ (MISCELLANEOUS) ×3 IMPLANT
SURGILUBE 2OZ TUBE FLIPTOP (MISCELLANEOUS) ×3 IMPLANT
SUT MNCRL 4-0 27 PS-2 XMFL (SUTURE) ×3 IMPLANT
SUT STRATA 2-0 30 CT-2 (SUTURE) ×3 IMPLANT
SUT VIC AB 0 CT2 27 (SUTURE) ×6 IMPLANT
SUTURE MNCRL 4-0 27XMF (SUTURE) ×3 IMPLANT
SYR 50ML LL SCALE MARK (SYRINGE) ×3 IMPLANT
TIP UTERINE 6.7X8CM BLUE DISP (MISCELLANEOUS) IMPLANT

## 2023-07-07 NOTE — Discharge Instructions (Signed)
Discharge instructions after  robotically-assisted total laparoscopic hysterectomy   For the next three days, take ibuprofen and acetaminophen on a schedule, every 8 hours. You can take them together or you can intersperse them, and take one every four hours. I also gave you gabapentin for nighttime, to help you sleep and also to control pain. Take gabapentin medicines at night for at least the next 3 nights. You also have a narcotic, oxycodone, to take as needed if the above medicines don't help.  Postop constipation is a major cause of pain. Stay well hydrated, walk as you tolerate, and take over the counter senna as well as stool softeners if you need them.   Signs and Symptoms to Report Call our office at [phone] if you have any of the following.   Fever over 100.4 degrees or higher  Severe stomach pain not relieved with pain medications  Bright red bleeding that's heavier than a period that does not slow with rest  To go the bathroom a lot (frequency), you can't hold your urine (urgency), or it hurts when you empty your bladder (urinate)  Chest pain  Shortness of breath  Pain in the calves of your legs  Severe nausea and vomiting not relieved with anti-nausea medications  Signs of infection around your wounds, such as redness, hot to touch, swelling, green/yellow drainage (like pus), bad smelling discharge  Any concerns  What You Can Expect after Surgery  You may see some pink tinged, bloody fluid and bruising around the wound. This is normal.  You may notice shoulder and neck pain. This is caused by the gas used during surgery to expand your abdomen so your surgeon could get to the uterus easier.  You may have a sore throat because of the tube in your mouth during general anesthesia. This will go away in 2 to 3 days.  You may have some stomach cramps.  You may notice spotting on your panties.  You may have pain around the incision sites.   Activities after Your  Discharge Follow these guidelines to help speed your recovery at home:  Do the coughing and deep breathing as you did in the hospital for 2 weeks. Use the small blue breathing device, called the incentive spirometer for 2 weeks.  Don't drive if you are in pain or taking narcotic pain medicine. You may drive when you can safely slam on the brakes, turn the wheel forcefully, and rotate your torso comfortably. This is typically 1-2 weeks. Practice in a parking lot or side street prior to attempting to drive regularly.   Ask others to help with household chores for 4 weeks.  Do not lift anything heavier that 10 pounds for 4-6 weeks. This includes pets, children, and groceries.  Don't do strenuous activities, exercises, or sports like vacuuming, tennis, squash, etc. until your doctor says it is safe to do so. ---Maintain pelvic rest for 12 weeks. This means nothing in the vagina or rectum at all (no douching, tampons, intercourse) for 12 weeks.   Walk as you feel able. Rest often since it may take two or three weeks for your energy level to return to normal.   You may climb stairs  Avoid constipation:   -Eat fruits, vegetables, and whole grains. Eat small meals as your appetite will take time to return to normal.   -Drink 6 to 8 glasses of water each day unless your doctor has told you to limit your fluids.   -Use a laxative or  stool softener as needed if constipation becomes a problem. You may take Miralax, metamucil, Citrucil, Colace, Senekot, FiberCon, etc. If this does not relieve the constipation, try two tablespoons of Milk Of Magnesia every 8 hours until your bowels move.   You may shower. Gently wash the wounds with a mild soap and water. Pat dry.  Do not get in a hot tub, swimming pool, etc. for 6 weeks.  Do not use lotions, oils, powders on the wounds.  Do not douche, use tampons, or have sex until your doctor says it is okay.  Take your pain medicine when you need it. The medicine may not  work as well if the pain is bad.  Take the medicines you were taking before surgery. Other medications you will need are pain medications (Norco or Percocet) and nausea medications (Zofran).

## 2023-07-07 NOTE — Anesthesia Procedure Notes (Signed)
 Procedure Name: Intubation Date/Time: 07/07/2023 7:58 AM  Performed by: Emeterio Reeve, CRNAPre-anesthesia Checklist: Patient identified, Emergency Drugs available, Suction available and Patient being monitored Patient Re-evaluated:Patient Re-evaluated prior to induction Oxygen Delivery Method: Circle system utilized Preoxygenation: Pre-oxygenation with 100% oxygen Induction Type: IV induction Ventilation: Mask ventilation without difficulty Laryngoscope Size: Mac and 3 Grade View: Grade I Tube type: Oral Tube size: 7.0 mm Number of attempts: 1 Airway Equipment and Method: Stylet and Oral airway Placement Confirmation: ETT inserted through vocal cords under direct vision, positive ETCO2 and breath sounds checked- equal and bilateral Secured at: 22 cm Tube secured with: Tape Dental Injury: Teeth and Oropharynx as per pre-operative assessment  Comments: Cords clear; no trauma. CA

## 2023-07-07 NOTE — Op Note (Addendum)
 Donna Fletcher PROCEDURE DATE: 07/07/2023  PREOPERATIVE DIAGNOSIS: AUB-F, Body mass index is 47.72 kg/m.  POSTOPERATIVE DIAGNOSIS: The same PROCEDURE:  XI ROBOTIC ASSISTED LAPAROSCOPIC HYSTERECTOMY AND SALPINGECTOMY:  Diagnostic CYSTOSCOPY: 52000 (CPT) Removal of right pelvic mass  SURGEON:  Dr. Christeen Douglas, MD ASSISTANT: Dr. Thomasene Mohair, MD  Anesthesiologist:  Anesthesiologist: Reed Breech, MD CRNA: Emeterio Reeve, CRNA; Jaye Beagle, CRNA  INDICATIONS: 33 y.o. F  here for definitive surgical management secondary to the indications listed under preoperative diagnoses; please see preoperative note for further details.  Risks of surgery were discussed with the patient including but not limited to: bleeding which may require transfusion or reoperation; infection which may require antibiotics; injury to bowel, bladder, ureters or other surrounding organs; need for additional procedures; thromboembolic phenomenon, incisional problems and other postoperative/anesthesia complications. Written informed consent was obtained.    FINDINGS:    External genitalia, vaginal canal and cervix negative for lesions. Intraoperative findings revealed a normal upper abdomen including bowel, liver, diaphragmatic surfaces, stomach, and omentum.   The uterus was enlarged with an intramural fibroid, with mild anterior scarring from prior c/s.  The right and left ovaries appeared normal.  Bilateral tubes appeared normal with evidence of prior BTL Right broad ligament calcified lesion without surrounding nodularity or adhesions.  Appendix wnl Some evidence of prior scarring in left pelvic sidewall  Bladder without ulcers or adhesions. No evidence of endometriosis scarring noted. Mucosa intact with active bilateral ureteral jets noted.  ANESTHESIA:    General INTRAVENOUS FLUIDS:900  ml ESTIMATED BLOOD LOSS:20 ml URINE OUTPUT: 150 ml  SPECIMENS: Uterus, cervix, bilateral fallopian  tubes and right adnexal mass COMPLICATIONS: None immediate   RATLH/BS:  PROCEDURE IN DETAIL: After informed consent was obtained, the patient was taken to the operating room where general anesthesia was obtained without difficulty. The patient was positioned in the dorsal lithotomy position in [place] stirrups and her arms were carefully tucked at her sides and the usual precautions were taken. Deep Trendelenburg (20-25 deg) was established to confirm that she does not shift on the table.  She was prepped and draped in normal sterile fashion.  Time-out was performed and a Foley catheter was placed into the bladder. A standard Rumi manipulator was then placed in the uterus without incident.  Preoperative prophylactic antibiotics were given through her iv.  After infiltration of local anesthetic at the proposed trocar sites, an 8 mm incision was created superior to the umbilicus, and an AirSeal 5mm was placed under direct visualization, after confirmation of OG tube working well. Pneumoperitoneum was created to a pressure of 15 mm Hg. The camera was placed and the abdomin surveyed, noting intact bowel below the site of entry. A survey of the pelvis and upper abdomen revealed the above findings. Two right and one left lateral 8-mm robotic ports were placed under direct visualization.  The patient was placed in deepTrendelenburg and the bowel was displaced up into the upper abdomen. The robot was left side docked. The instruments were placed under direct visualization.   The ureters were identified bilaterally coursing outside of the operative field. Round ligaments were divided on each side with the EndoShears and the retroperitoneal space was opened bilaterally. The posterior leaflet of the broad was taken down to the level of the IP ligament. The anterior leaflet of the broad ligament was carefully taken down to the midline.  A bladder flap was created and the bladder was dissected down off the lower  uterine segment and cervix using  endoshears and electrocautery.   The Fallopian tubes were divided from the ovaries, and care taken to hemostatically transect the utero-ovarian ligament.  Fallopian tubes were sent with the specimen. The peritoneum was taken down to the level of the internal os, and the uterine arteries skeletonized. With strong cephalad pressure from the uterine manipulator, bipolar cautery was used to seal and transect the uterine arteries, and the pedicles allowed to fall away laterally.  A colpotomy was performed circumferentially along the Rumi ring with monopolar electrocautery and the cervix was incised from the vagina using the laparoscopic scissors. The specimen was removed through the vagina.  A pneumo balloon was placed in the vagina and the vaginal cuff was then closed in a running continuous fashion using the  0 V-Lock suture with careful attention to include the vaginal cuff angles, the uterosacral ligaments and the vaginal mucosa within the closure.  Hemostasis was secured with intraabdominal pressure and review of all surgical sites. The intraperitoneal pressure was dropped, and all planes of dissection, vascular pedicles and the vaginal cuff were found to be hemostatic.  The robot was undocked.   Attention was turned to the bladder and cystoscopy showed vigorous bilateral ureteral jets.  No stitches were visualized in the bladder during cystoscopy. No reason for pelvic pain noted in the bladder.  The lateral trocars were removed under visualization.  The CO2 gas was released and several deep breaths given to remove any remaining CO2 from the peritoneal cavity.  The skin incisions were closed with 4-0 Monocryl subcuticular stitch and Dermabond.     Anesthesia was reversed without difficulty.  The patient tolerated the procedure well.  Sponge, lap and needle counts were correct x2.  The patient was taken to recovery room in excellent condition.

## 2023-07-07 NOTE — Anesthesia Postprocedure Evaluation (Signed)
 Anesthesia Post Note  Patient: Donna Fletcher  Procedure(s) Performed: XI ROBOTIC ASSISTED LAPAROSCOPIC HYSTERECTOMY AND SALPINGECTOMY (Bilateral: Abdomen) CYSTOSCOPY (Bladder)  Patient location during evaluation: PACU Anesthesia Type: General Level of consciousness: awake and alert, oriented and patient cooperative Pain management: pain level controlled Vital Signs Assessment: post-procedure vital signs reviewed and stable Respiratory status: spontaneous breathing, nonlabored ventilation and respiratory function stable Cardiovascular status: blood pressure returned to baseline and stable Postop Assessment: adequate PO intake Anesthetic complications: no   No notable events documented.   Last Vitals:  Vitals:   07/07/23 1125 07/07/23 1130  BP:    Pulse: 85 82  Resp: 15 15  Temp:    SpO2: 99% 99%    Last Pain:  Vitals:   07/07/23 1130  TempSrc:   PainSc: 3                  Reed Breech

## 2023-07-07 NOTE — Anesthesia Preprocedure Evaluation (Addendum)
 Anesthesia Evaluation  Patient identified by MRN, date of birth, ID band Patient awake    Reviewed: Allergy & Precautions, NPO status , Patient's Chart, lab work & pertinent test results  History of Anesthesia Complications (+) PONV and history of anesthetic complications  Airway Mallampati: I   Neck ROM: Full    Dental  (+) Missing   Pulmonary former smoker (quit 3 years ago; current occasional vaping)   Pulmonary exam normal breath sounds clear to auscultation       Cardiovascular Exercise Tolerance: Good negative cardio ROS Normal cardiovascular exam Rhythm:Regular Rate:Normal     Neuro/Psych  PSYCHIATRIC DISORDERS Anxiety Depression    negative neurological ROS     GI/Hepatic negative GI ROS,,,  Endo/Other    Class 3 obesityPCOS  Renal/GU negative Renal ROS     Musculoskeletal   Abdominal   Peds  Hematology negative hematology ROS (+)   Anesthesia Other Findings   Reproductive/Obstetrics                             Anesthesia Physical Anesthesia Plan  ASA: 3  Anesthesia Plan: General   Post-op Pain Management:    Induction: Intravenous  PONV Risk Score and Plan: 4 or greater and Ondansetron, Dexamethasone, Treatment may vary due to age or medical condition and Scopolamine patch - Pre-op  Airway Management Planned: Oral ETT  Additional Equipment:   Intra-op Plan:   Post-operative Plan: Extubation in OR  Informed Consent: I have reviewed the patients History and Physical, chart, labs and discussed the procedure including the risks, benefits and alternatives for the proposed anesthesia with the patient or authorized representative who has indicated his/her understanding and acceptance.     Dental advisory given  Plan Discussed with: CRNA  Anesthesia Plan Comments: (Patient consented for risks of anesthesia including but not limited to:  - adverse reactions to  medications - damage to eyes, teeth, lips or other oral mucosa - nerve damage due to positioning  - sore throat or hoarseness - damage to heart, brain, nerves, lungs, other parts of body or loss of life  Informed patient about role of CRNA in peri- and intra-operative care.  Patient voiced understanding.)       Anesthesia Quick Evaluation

## 2023-07-07 NOTE — Transfer of Care (Signed)
 Immediate Anesthesia Transfer of Care Note  Patient: Donna Fletcher  Procedure(s) Performed: XI ROBOTIC ASSISTED LAPAROSCOPIC HYSTERECTOMY AND SALPINGECTOMY (Bilateral: Abdomen) CYSTOSCOPY (Bladder)  Patient Location: PACU  Anesthesia Type:General  Level of Consciousness: drowsy and patient cooperative  Airway & Oxygen Therapy: Patient Spontanous Breathing and Patient connected to face mask oxygen  Post-op Assessment: Report given to RN and Post -op Vital signs reviewed and stable  Post vital signs: stable  Last Vitals:  Vitals Value Taken Time  BP 133/91 07/07/23 1045  Temp 36.2 C 07/07/23 1037  Pulse 84 07/07/23 1046  Resp 17 07/07/23 1046  SpO2 100 % 07/07/23 1046  Vitals shown include unfiled device data.  Last Pain:  Vitals:   07/07/23 0706  TempSrc: Temporal  PainSc: 0-No pain         Complications: No notable events documented.
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
## 2023-07-08 ENCOUNTER — Encounter: Payer: Self-pay | Admitting: Obstetrics and Gynecology

## 2023-07-08 LAB — SURGICAL PATHOLOGY

## 2024-05-01 ENCOUNTER — Telehealth: Admitting: Physician Assistant

## 2024-05-01 DIAGNOSIS — R3989 Other symptoms and signs involving the genitourinary system: Secondary | ICD-10-CM

## 2024-05-01 MED ORDER — NITROFURANTOIN MONOHYD MACRO 100 MG PO CAPS: 100.0000 mg | ORAL_CAPSULE | Freq: Two times a day (BID) | ORAL | 0 refills | Status: DC

## 2024-05-01 NOTE — Progress Notes (Signed)

## 2024-05-05 ENCOUNTER — Telehealth: Admitting: Nurse Practitioner

## 2024-05-05 DIAGNOSIS — B3731 Acute candidiasis of vulva and vagina: Secondary | ICD-10-CM

## 2024-05-05 MED ORDER — FLUCONAZOLE 150 MG PO TABS: 150.0000 mg | ORAL_TABLET | ORAL | 0 refills | Status: DC | PRN

## 2024-05-05 NOTE — Progress Notes (Signed)

## 2024-06-05 ENCOUNTER — Telehealth: Admitting: Emergency Medicine

## 2024-06-05 DIAGNOSIS — H6991 Unspecified Eustachian tube disorder, right ear: Secondary | ICD-10-CM | POA: Diagnosis not present

## 2024-06-05 MED ORDER — FLUTICASONE PROPIONATE 50 MCG/ACT NA SUSP: 2.0000 | Freq: Every day | NASAL | 0 refills | Status: AC

## 2024-06-05 NOTE — Progress Notes (Signed)
 E-Visit for Ear Pain - Eustachian Tube Dysfunction   We are sorry that you are not feeling well. Here is how we plan to help!  Based on what you have shared with me it looks like you have Eustachian Tube Dysfunction.  Eustachian Tube Dysfunction is a condition where the tubes that connect your middle ears to your upper throat become blocked. This can lead to discomfort, hearing difficulties and a feeling of fullness in your ear. Eustachian tube dysfunction usually resolves itself in a few days. The usual symptoms include: Hearing problems Tinnitus, or ringing in your ears Clicking or popping sounds A feeling of fullness in your ears Pain that mimics an ear infection Dizziness, vertigo or balance problems A tickling sensation in your ears  ?Eustachian tube dysfunction symptoms may get worse in higher altitudes. This is called barotrauma, and it can happen while scuba diving, flying in an airplane or driving in the mountains.   What causes eustachian tube dysfunction? Allergies and infections (like the common cold and the flu) are the most common causes of eustachian tube dysfunction. These conditions can cause inflammation and mucus buildup, leading to blockage. GERD, or chronic acid reflux, can also cause ETD. This is because stomach acid can back up into your throat and result in inflammation. As mentioned above, altitude changes can also cause ETD.   What are some common eustachian tube dysfunction treatments? In most cases, treatment isn't necessary because ETD often resolves on its own. However, you might need treatment if your symptoms linger for more than two weeks.    Eustachian tube dysfunction treatment depends on the cause and the severity of your condition. Treatments may include home remedies, medications or, in severe cases, surgery.     HOME CARE: Sometimes simple home remedies can help with mild cases of eustachian tube dysfunction. To try and clear the blockage, you  can: Chew gum. Yawn. Swallow. Try the Valsalva maneuver (breathing out forcefully while closing your mouth and pinching your nostrils). Use a saline spray to clear out nasal passages.  MEDICATIONS: Over-the-counter medications can help if allergies are causing eustachian tube dysfunction. Try antihistamines (like cetirizine or diphenhydramine) to ease your symptoms. If you have discomfort, pain relievers -- such as acetaminophen  or ibuprofen -- can help.  Sometimes intranasal glucocorticosteroids (like Flonase  or Nasacort) help.  I have prescribed Fluticasone  50 mcg/spray 2 sprays in each nostril daily for 10-14 days  I also recommend using saline spray several times a day or try using saline irrigation, such as with a neti pot, several times a day while you have ear pressure symptoms. Many neti pots come with salt packets premeasured to use to make saline. If you use your own salt, make sure it is kosher salt or sea salt (don't use table salt as it has iodine  in it and you don't need that in your nose). Use distilled water to make saline. If you mix your own saline using your own salt, the recipe is 1/4 teaspoon salt in 1 cup warm water. Using saline irrigation can help prevent and treat ear symptoms like yours and other things like sinus infections.     GET HELP RIGHT AWAY IF: Fever is over 102.2 degrees. You develop progressive ear pain or hearing loss. Ear symptoms persist longer than 3 days after treatment.  MAKE SURE YOU: Understand these instructions. Will watch your condition. Will get help right away if you are not doing well or get worse.  Thank you for choosing an e-visit.  Your e-visit answers were reviewed by a board certified advanced clinical practitioner to complete your personal care plan. Depending upon the condition, your plan could have included both over the counter or prescription medications.  Please review your pharmacy choice. Make sure the pharmacy is open so  you can pick up the prescription now. If there is a problem, you may contact your provider through [employer] and have the prescription routed to another pharmacy.  Your safety is important to us . If you have drug allergies check your prescription carefully.   For the next 24 hours you can use MyChart to ask questions about today's visit, request a non-urgent call back, or ask for a work or school excuse. You will get an email with a survey after your eVisit asking about your experience. We would appreciate your feedback. I hope that your e-visit has been valuable and will aid in your recovery.   I have spent 5 minutes in review of e-visit questionnaire, review and updating patient chart, medical decision making and response to patient.   Jon Belt, PhD, FNP-BC

## 2024-06-10 ENCOUNTER — Telehealth: Admitting: Family Medicine

## 2024-06-10 DIAGNOSIS — R3989 Other symptoms and signs involving the genitourinary system: Secondary | ICD-10-CM

## 2024-06-10 DIAGNOSIS — B3731 Acute candidiasis of vulva and vagina: Secondary | ICD-10-CM

## 2024-06-10 DIAGNOSIS — N3000 Acute cystitis without hematuria: Secondary | ICD-10-CM

## 2024-06-10 MED ORDER — NITROFURANTOIN MONOHYD MACRO 100 MG PO CAPS: 100.0000 mg | ORAL_CAPSULE | Freq: Two times a day (BID) | ORAL | 0 refills | Status: AC

## 2024-06-10 MED ORDER — FLUCONAZOLE 150 MG PO TABS: 150.0000 mg | ORAL_TABLET | ORAL | 0 refills | Status: AC | PRN

## 2024-06-10 NOTE — Progress Notes (Signed)
